# Patient Record
Sex: Female | Born: 1965 | Race: White | Hispanic: Refuse to answer | Marital: Married | Smoking: Never smoker
Health system: Southern US, Community
[De-identification: ages and names within clinical notes are randomized; demographics above are authoritative.]

## PROBLEM LIST (undated history)

## (undated) DIAGNOSIS — F329 Major depressive disorder, single episode, unspecified: Secondary | ICD-10-CM

## (undated) DIAGNOSIS — F419 Anxiety disorder, unspecified: Secondary | ICD-10-CM

## (undated) DIAGNOSIS — F32A Depression, unspecified: Secondary | ICD-10-CM

## (undated) DIAGNOSIS — E785 Hyperlipidemia, unspecified: Secondary | ICD-10-CM

## (undated) DIAGNOSIS — D649 Anemia, unspecified: Secondary | ICD-10-CM

## (undated) HISTORY — PX: APPENDECTOMY: SHX54

## (undated) HISTORY — DX: Major depressive disorder, single episode, unspecified: F32.9

## (undated) HISTORY — DX: Hyperlipidemia, unspecified: E78.5

## (undated) HISTORY — PX: EYE SURGERY: SHX253

## (undated) HISTORY — DX: Anxiety disorder, unspecified: F41.9

## (undated) HISTORY — DX: Depression, unspecified: F32.A

## (undated) HISTORY — PX: HAND SURGERY: SHX662

## (undated) HISTORY — DX: Anemia, unspecified: D64.9

## (undated) HISTORY — PX: TUBAL LIGATION: SHX77

---

## 1991-04-16 HISTORY — PX: OTHER SURGICAL HISTORY: SHX169

## 2001-04-15 HISTORY — PX: REFRACTIVE SURGERY: SHX103

## 2009-04-27 DIAGNOSIS — F32A Depression, unspecified: Secondary | ICD-10-CM | POA: Insufficient documentation

## 2009-04-27 DIAGNOSIS — L989 Disorder of the skin and subcutaneous tissue, unspecified: Secondary | ICD-10-CM | POA: Insufficient documentation

## 2009-04-27 DIAGNOSIS — B009 Herpesviral infection, unspecified: Secondary | ICD-10-CM | POA: Insufficient documentation

## 2009-05-13 DIAGNOSIS — E7800 Pure hypercholesterolemia, unspecified: Secondary | ICD-10-CM | POA: Insufficient documentation

## 2009-05-17 DIAGNOSIS — D649 Anemia, unspecified: Secondary | ICD-10-CM

## 2009-05-17 DIAGNOSIS — D509 Iron deficiency anemia, unspecified: Secondary | ICD-10-CM | POA: Insufficient documentation

## 2009-05-17 HISTORY — DX: Anemia, unspecified: D64.9

## 2014-07-20 LAB — CBC AND DIFFERENTIAL
HCT: 40 % (ref 36–46)
HEMOGLOBIN: 13.2 g/dL (ref 12.0–16.0)
PLATELETS: 306 10*3/uL (ref 150–399)
WBC: 6.6 10^3/mL

## 2014-07-20 LAB — BASIC METABOLIC PANEL
BUN: 12 mg/dL (ref 4–21)
CREATININE: 0.7 mg/dL (ref 0.5–1.1)
GLUCOSE: 85 mg/dL
POTASSIUM: 5.6 mmol/L — AB (ref 3.4–5.3)
SODIUM: 143 mmol/L (ref 137–147)

## 2014-07-20 LAB — HEPATIC FUNCTION PANEL
ALT: 14 U/L (ref 7–35)
AST: 17 U/L (ref 13–35)

## 2014-07-20 LAB — HM PAP SMEAR: HM PAP: NEGATIVE

## 2014-12-15 ENCOUNTER — Other Ambulatory Visit: Payer: Self-pay | Admitting: Physician Assistant

## 2014-12-15 DIAGNOSIS — F419 Anxiety disorder, unspecified: Secondary | ICD-10-CM

## 2014-12-15 NOTE — Telephone Encounter (Signed)
Pt contacted office for refill request on the following medications:  Alprazolam 0.5mg .  [facility]  YQ#[phone]/[id number]

## 2014-12-16 ENCOUNTER — Other Ambulatory Visit: Payer: Self-pay

## 2014-12-16 NOTE — Telephone Encounter (Signed)
Please refill. Thanks!

## 2014-12-21 DIAGNOSIS — F411 Generalized anxiety disorder: Secondary | ICD-10-CM | POA: Insufficient documentation

## 2014-12-21 DIAGNOSIS — F419 Anxiety disorder, unspecified: Secondary | ICD-10-CM | POA: Insufficient documentation

## 2014-12-21 MED ORDER — ALPRAZOLAM 0.5 MG PO TBDP: ORAL_TABLET | ORAL | Status: DC

## 2014-12-21 NOTE — Telephone Encounter (Signed)
This has been printed and placed up front for pick up.

## 2014-12-23 NOTE — Telephone Encounter (Signed)
[facility] [address] Per pt's request.  Thanks,

## 2015-03-26 ENCOUNTER — Other Ambulatory Visit: Payer: Self-pay | Admitting: Family Medicine

## 2015-03-27 NOTE — Telephone Encounter (Signed)
See refill request.

## 2015-03-28 ENCOUNTER — Telehealth: Payer: Self-pay | Admitting: Physician Assistant

## 2015-03-28 ENCOUNTER — Other Ambulatory Visit: Payer: Self-pay | Admitting: Family Medicine

## 2015-03-28 DIAGNOSIS — F419 Anxiety disorder, unspecified: Secondary | ICD-10-CM

## 2015-03-28 NOTE — Telephone Encounter (Signed)
See refill request.

## 2015-03-28 NOTE — Telephone Encounter (Signed)
Pt stated she has been trying to get a refill for [facility] Rock NephewPt stated that the last time she got dissolvable tablets and she didn't like them. Pt would like to go back to taking the tablets that you can swallow. Pt would like a call back to advise her of the RX. Thanks TNP

## 2015-03-29 MED ORDER — ALPRAZOLAM 0.5 MG PO TABS: 0.5000 mg | ORAL_TABLET | Freq: Every day | ORAL | Status: DC | PRN

## 2015-03-29 NOTE — Telephone Encounter (Signed)
Can we please call in Rx for alprazolam 0.5 mg tab Take 1 tab PO daily prn #30 refills 1 as ordered above to [facility]. Thanks!!!

## 2015-03-29 NOTE — Telephone Encounter (Signed)
LM Re: [facility] [address]  Thanks,  -Daneshia Tavano

## 2015-08-28 ENCOUNTER — Telehealth: Payer: Self-pay | Admitting: Physician Assistant

## 2015-08-28 DIAGNOSIS — F419 Anxiety disorder, unspecified: Secondary | ICD-10-CM

## 2015-08-28 MED ORDER — ALPRAZOLAM 0.5 MG PO TABS: 0.5000 mg | ORAL_TABLET | Freq: Every day | ORAL | Status: DC | PRN

## 2015-08-28 NOTE — Telephone Encounter (Signed)
Pt contacted office for refill request on the following medications: ALPRAZolam (XANAX) 0.5 MG tablet to [facility] Rock NephewPt is scheduled for CPE on 08/31/15. Last OV: 07/20/14 Last written: 03/29/15 with 1 refill. Please advise. Thanks TNP

## 2015-08-28 NOTE — Telephone Encounter (Signed)
Prescription called in to [facility] [address].  Thanks,  -Joseline 

## 2015-08-28 NOTE — Telephone Encounter (Signed)
Can we please call in the Rx as above for alprazolam 0.5mg  1 tab PO daily prn anxiety #30 NR. Thanks.

## 2015-08-28 NOTE — Telephone Encounter (Signed)
Please advise.  Thanks,  -Alixis Harmon 

## 2015-08-31 ENCOUNTER — Encounter: Payer: Self-pay | Admitting: Physician Assistant

## 2015-09-05 DIAGNOSIS — N951 Menopausal and female climacteric states: Secondary | ICD-10-CM | POA: Insufficient documentation

## 2015-09-05 DIAGNOSIS — H9190 Unspecified hearing loss, unspecified ear: Secondary | ICD-10-CM | POA: Insufficient documentation

## 2015-09-05 DIAGNOSIS — M79609 Pain in unspecified limb: Secondary | ICD-10-CM | POA: Insufficient documentation

## 2015-09-05 DIAGNOSIS — M65319 Trigger thumb, unspecified thumb: Secondary | ICD-10-CM | POA: Insufficient documentation

## 2015-09-06 ENCOUNTER — Encounter: Payer: Self-pay | Admitting: Physician Assistant

## 2015-09-06 ENCOUNTER — Ambulatory Visit (INDEPENDENT_AMBULATORY_CARE_PROVIDER_SITE_OTHER): Payer: BC Managed Care – PPO | Admitting: Physician Assistant

## 2015-09-06 VITALS — BP 100/60 | HR 65 | Temp 97.5°F | Resp 16 | Ht 63.0 in | Wt 143.2 lb

## 2015-09-06 DIAGNOSIS — Z0000 Encounter for general adult medical examination without abnormal findings: Secondary | ICD-10-CM

## 2015-09-06 DIAGNOSIS — Z1239 Encounter for other screening for malignant neoplasm of breast: Secondary | ICD-10-CM | POA: Diagnosis not present

## 2015-09-06 DIAGNOSIS — D509 Iron deficiency anemia, unspecified: Secondary | ICD-10-CM | POA: Diagnosis not present

## 2015-09-06 DIAGNOSIS — H9193 Unspecified hearing loss, bilateral: Secondary | ICD-10-CM

## 2015-09-06 DIAGNOSIS — E7800 Pure hypercholesterolemia, unspecified: Secondary | ICD-10-CM | POA: Diagnosis not present

## 2015-09-06 DIAGNOSIS — F419 Anxiety disorder, unspecified: Secondary | ICD-10-CM | POA: Diagnosis not present

## 2015-09-06 NOTE — Progress Notes (Signed)
Patient: Pamela Taylor, Female    DOB: 03/24/1966, 50 y.o.   MRN: [id number] Visit Date: 09/06/2015  Today's Provider: Margaretann LovelessJennifer M Burnette, PA-C   Chief Complaint  Patient presents with  . Annual Exam   Subjective:    Annual physical exam Pamela Taylor is a 50 y.o. female who presents today for health maintenance and complete physical. She feels well. She reports exercising daily she does cardio-runs and walk and Yoga. She reports she is sleeping well.   Colonoscopy:09/2013. Repeat in 10 years. No polyps-Normal Pap:07/25/2014 Normal Mammogram: done in [place] and reported normal -----------------------------------------------------------------   Review of Systems  Constitutional: Negative.   HENT: Negative.   Eyes: Negative.   Respiratory: Negative.   Cardiovascular: Negative.   Gastrointestinal: Negative.   Endocrine: Negative.   Genitourinary: Negative.   Musculoskeletal: Negative.   Skin: Negative.   Allergic/Immunologic: Negative.   Neurological: Negative.   Hematological: Negative.   Psychiatric/Behavioral: Negative.     Social History      She  reports that she has never smoked. She has never used smokeless tobacco. She reports that she drinks alcohol. She reports that she does not use illicit drugs.       Social History   Social History  . Marital Status: Married    Spouse Name: N/A  . Number of Children: N/A  . Years of Education: N/A   Social History Main Topics  . Smoking status: Never Smoker   . Smokeless tobacco: Never Used  . Alcohol Use: Yes     Comment: Occasional   . Drug Use: No  . Sexual Activity: Not Asked   Other Topics Concern  . None   Social History Narrative    Past Medical History  Diagnosis Date  . Anemia 05/17/2009  . Hyperlipidemia   . Depression      Patient Active Problem List   Diagnosis Date Noted  . Difficulty hearing 09/05/2015  . Pain in soft tissues of limb 09/05/2015  . 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 Acute anxiety 12/21/2014  . Anemia, iron deficiency 05/17/2009  . Hypercholesterolemia without hypertriglyceridemia 05/13/2009  . Clinical depression 04/27/2009  . Dermatologic disease 04/27/2009  . Herpes 04/27/2009    Past Surgical History  Procedure Laterality Date  . Refractive surgery Bilateral 2003  . Cesarean section  [phone],1992,1993  . Btl  1993    Family History        Family Status  Relation Status Death Age  . Father      substance abuse, alcoholism in revocery. Early Dementia, CHF. No previous AMI; emphysema  . Sister Alive   . Brother Alive         Her family history includes Cancer (age of onset: 3855) in her mother; Depression in her father.    No Known Allergies  Previous Medications   ALPRAZOLAM (XANAX) 0.5 MG TABLET    Take 1 tablet (0.5 mg total) by mouth daily as needed for anxiety.   PAROXETINE (PAXIL) 20 MG TABLET    Take 1 tablet by mouth daily.    Patient Care Team: Margaretann LovelessJennifer M Burnette, PA-C as PCP - General (Family Medicine)     Objective:   Vitals: BP 100/60 mmHg  Pulse 65  Temp(Src) 97.5 F (36.4 C) (Oral)  Resp 16  Ht 5\' 3"  (1.6 m)  Wt 143 lb 3.2 oz (64.955 kg)  BMI 25.37 kg/m2   Physical Exam  Constitutional: She  is oriented to person, place, and time. She appears well-developed and well-nourished. No distress.  HENT:  Head: Normocephalic and atraumatic.  Right Ear: Hearing, tympanic membrane, external ear and ear canal normal.  Left Ear: Hearing, tympanic membrane, external ear and ear canal normal.  Nose: Nose normal.  Mouth/Throat: Uvula is midline, oropharynx is clear and moist and mucous membranes are normal. No oropharyngeal exudate.  Eyes: Conjunctivae and EOM are normal. Pupils are equal, round, and reactive to light. Right eye exhibits no discharge. Left eye exhibits no discharge. No scleral icterus.  Neck: Normal range of motion. Neck supple. No JVD  present. Carotid bruit is not present. No tracheal deviation present. No thyromegaly present.  Cardiovascular: Normal rate, regular rhythm, normal heart sounds and intact distal pulses.  Exam reveals no gallop and no friction rub.   No murmur heard. Pulmonary/Chest: Effort normal and breath sounds normal. No respiratory distress. She has no wheezes. She has no rales. She exhibits no tenderness. Right breast exhibits no inverted nipple, no mass, no nipple discharge, no skin change and no tenderness. Left breast exhibits no inverted nipple, no mass, no nipple discharge, no skin change and no tenderness. Breasts are symmetrical.  Abdominal: Soft. Bowel sounds are normal. She exhibits no distension and no mass. There is no tenderness. There is no rebound and no guarding. Hernia confirmed negative in the right inguinal area and confirmed negative in the left inguinal area.  Genitourinary: Rectum normal, vagina normal and uterus normal. No breast swelling, tenderness, discharge or bleeding. Pelvic exam was performed with patient supine. There is no rash, tenderness, lesion or injury on the right labia. There is no rash, tenderness, lesion or injury on the left labia. Cervix exhibits no motion tenderness, no discharge and no friability. Right adnexum displays no mass, no tenderness and no fullness. Left adnexum displays no mass, no tenderness and no fullness. No erythema, tenderness or bleeding in the vagina. No signs of injury around the vagina. No vaginal discharge found.  Musculoskeletal: Normal range of motion. She exhibits no edema or tenderness.  Lymphadenopathy:    She has no cervical adenopathy.       Right: No inguinal adenopathy present.       Left: No inguinal adenopathy present.  Neurological: She is alert and oriented to person, place, and time. She has normal reflexes. No cranial nerve deficit. Coordination normal.  Skin: Skin is warm and dry. No rash noted. She is not diaphoretic.  Psychiatric:  She has a normal mood and affect. Her behavior is normal. Judgment and thought content normal.  Vitals reviewed.    Depression Screen PHQ 2/9 Scores 09/06/2015  PHQ - 2 Score 0      Assessment & Plan:     Routine Health Maintenance and Physical Exam  Exercise Activities and Dietary recommendations Goals    None       There is no immunization history on file for this patient.  Health Maintenance  Topic Date Due  . Hepatitis C Screening  05-20-1965  . HIV Screening  04/29/1978  . TETANUS/TDAP  04/29/1982  . PAP SMEAR  04/29/1984  . MAMMOGRAM  04/29/2013  . INFLUENZA VACCINE  11/14/2015  . COLONOSCOPY  09/14/2023      Discussed health benefits of physical activity, and encouraged her to engage in regular exercise appropriate for her age and condition.  1. Annual physical exam Normal physical exam today. Will check labs as below and f/u pending lab results. If labs are stable and  WNL she will not need to have these rechecked for one year at her next annual physical exam. She is to call the office in the meantime if she has any acute issue, questions or concerns. - CBC with Differential/Platelet - Comprehensive metabolic panel - TSH  2. Breast cancer screening Breast exam today was normal. There is no family history of breast cancer. She does perform regular self breast exams. Mammogram was ordered as below. Information for [facility] clinic was given to patient so she may schedule her mammogram at her convenience. - MM DIGITAL SCREENING BILATERAL; Future  3. Acute anxiety Stable. Continue current medical treatment of xanax prn.  4. Anemia, iron deficiency Will check CBC and follow up pending results.  5. Hypercholesterolemia without hypertriglyceridemia Will check labs and f/u pending results. - Lipid panel  6. Difficulty hearing, bilateral Reports increased hearing difficulties bilaterally that is starting to bother her. Will refer to ENT for audiological  testing. - Ambulatory referral to ENT    --------------------------------------------------------------------

## 2015-09-06 NOTE — Patient Instructions (Signed)
Health Maintenance, Female Adopting a healthy lifestyle and getting preventive care can go a long way to promote health and wellness. Talk with your health care provider about what schedule of regular examinations is right for you. This is a good chance for you to check in with your provider about disease prevention and staying healthy. In between checkups, there are plenty of things you can do on your own. Experts have done a lot of research about which lifestyle changes and preventive measures are most likely to keep you healthy. Ask your health care provider for more information. WEIGHT AND DIET  Eat a healthy diet  Be sure to include plenty of vegetables, fruits, low-fat dairy products, and lean protein.  Do not eat a lot of foods high in solid fats, added sugars, or salt.  Get regular exercise. This is one of the most important things you can do for your health.  Most adults should exercise for at least 150 minutes each week. The exercise should increase your heart rate and make you sweat (moderate-intensity exercise).  Most adults should also do strengthening exercises at least twice a week. This is in addition to the moderate-intensity exercise.  Maintain a healthy weight  Body mass index (BMI) is a measurement that can be used to identify possible weight problems. It estimates body fat based on height and weight. Your health care provider can help determine your BMI and help you achieve or maintain a healthy weight.  For females 20 years of age and older:   A BMI below 18.5 is considered underweight.  A BMI of 18.5 to 24.9 is normal.  A BMI of 25 to 29.9 is considered overweight.  A BMI of 30 and above is considered obese.  Watch levels of cholesterol and blood lipids  You should start having your blood tested for lipids and cholesterol at 52 years of age, then have this test every 5 years.  You may need to have your cholesterol levels checked more often if:  Your lipid  or cholesterol levels are high.  You are older than 52 years of age.  You are at high risk for heart disease.  CANCER SCREENING   Lung Cancer  Lung cancer screening is recommended for adults 55-80 years old who are at high risk for lung cancer because of a history of smoking.  A yearly low-dose CT scan of the lungs is recommended for people who:  Currently smoke.  Have quit within the past 15 years.  Have at least a 30-pack-year history of smoking. A pack year is smoking an average of one pack of cigarettes a day for 1 year.  Yearly screening should continue until it has been 15 years since you quit.  Yearly screening should stop if you develop a health problem that would prevent you from having lung cancer treatment.  Breast Cancer  Practice breast self-awareness. This means understanding how your breasts normally appear and feel.  It also means doing regular breast self-exams. Let your health care provider know about any changes, no matter how small.  If you are in your 20s or 30s, you should have a clinical breast exam (CBE) by a health care provider every 1-3 years as part of a regular health exam.  If you are 40 or older, have a CBE every year. Also consider having a breast X-ray (mammogram) every year.  If you have a family history of breast cancer, talk to your health care provider about genetic screening.  If you   are at high risk for breast cancer, talk to your health care provider about having an MRI and a mammogram every year.  Breast cancer gene (BRCA) assessment is recommended for women who have family members with BRCA-related cancers. BRCA-related cancers include:  Breast.  Ovarian.  Tubal.  Peritoneal cancers.  Results of the assessment will determine the need for genetic counseling and BRCA1 and BRCA2 testing. Cervical Cancer Your health care provider may recommend that you be screened regularly for cancer of the pelvic organs (ovaries, uterus, and  vagina). This screening involves a pelvic examination, including checking for microscopic changes to the surface of your cervix (Pap test). You may be encouraged to have this screening done every 3 years, beginning at age 21.  For women ages 30-65, health care providers may recommend pelvic exams and Pap testing every 3 years, or they may recommend the Pap and pelvic exam, combined with testing for human papilloma virus (HPV), every 5 years. Some types of HPV increase your risk of cervical cancer. Testing for HPV may also be done on women of any age with unclear Pap test results.  Other health care providers may not recommend any screening for nonpregnant women who are considered low risk for pelvic cancer and who do not have symptoms. Ask your health care provider if a screening pelvic exam is right for you.  If you have had past treatment for cervical cancer or a condition that could lead to cancer, you need Pap tests and screening for cancer for at least 20 years after your treatment. If Pap tests have been discontinued, your risk factors (such as having a new sexual partner) need to be reassessed to determine if screening should resume. Some women have medical problems that increase the chance of getting cervical cancer. In these cases, your health care provider may recommend more frequent screening and Pap tests. Colorectal Cancer  This type of cancer can be detected and often prevented.  Routine colorectal cancer screening usually begins at 52 years of age and continues through 52 years of age.  Your health care provider may recommend screening at an earlier age if you have risk factors for colon cancer.  Your health care provider may also recommend using home test kits to check for hidden blood in the stool.  A small camera at the end of a tube can be used to examine your colon directly (sigmoidoscopy or colonoscopy). This is done to check for the earliest forms of colorectal  cancer.  Routine screening usually begins at age 50.  Direct examination of the colon should be repeated every 5-10 years through 52 years of age. However, you may need to be screened more often if early forms of precancerous polyps or small growths are found. Skin Cancer  Check your skin from head to toe regularly.  Tell your health care provider about any new moles or changes in moles, especially if there is a change in a mole's shape or color.  Also tell your health care provider if you have a mole that is larger than the size of a pencil eraser.  Always use sunscreen. Apply sunscreen liberally and repeatedly throughout the day.  Protect yourself by wearing long sleeves, pants, a wide-brimmed hat, and sunglasses whenever you are outside. HEART DISEASE, DIABETES, AND HIGH BLOOD PRESSURE   High blood pressure causes heart disease and increases the risk of stroke. High blood pressure is more likely to develop in:  People who have blood pressure in the high end   of the normal range (130-139/85-89 mm Hg).  People who are overweight or obese.  People who are African American.  If you are 38-23 years of age, have your blood pressure checked every 3-5 years. If you are 61 years of age or older, have your blood pressure checked every year. You should have your blood pressure measured twice--once when you are at a hospital or clinic, and once when you are not at a hospital or clinic. Record the average of the two measurements. To check your blood pressure when you are not at a hospital or clinic, you can use:  An automated blood pressure machine at a pharmacy.  A home blood pressure monitor.  If you are between 45 years and 39 years old, ask your health care provider if you should take aspirin to prevent strokes.  Have regular diabetes screenings. This involves taking a blood sample to check your fasting blood sugar level.  If you are at a normal weight and have a low risk for diabetes,  have this test once every three years after 52 years of age.  If you are overweight and have a high risk for diabetes, consider being tested at a younger age or more often. PREVENTING INFECTION  Hepatitis B  If you have a higher risk for hepatitis B, you should be screened for this virus. You are considered at high risk for hepatitis B if:  You were born in a country where hepatitis B is common. Ask your health care provider which countries are considered high risk.  Your parents were born in a high-risk country, and you have not been immunized against hepatitis B (hepatitis B vaccine).  You have HIV or AIDS.  You use needles to inject street drugs.  You live with someone who has hepatitis B.  You have had sex with someone who has hepatitis B.  You get hemodialysis treatment.  You take certain medicines for conditions, including cancer, organ transplantation, and autoimmune conditions. Hepatitis C  Blood testing is recommended for:  Everyone born from [birth year] through 1967.  Anyone with known risk factors for hepatitis C. Sexually transmitted infections (STIs)  You should be screened for sexually transmitted infections (STIs) including gonorrhea and chlamydia if:  You are sexually active and are younger than 52 years of age.  You are older than 53 years of age and your health care provider tells you that you are at risk for this type of infection.  Your sexual activity has changed since you were last screened and you are at an increased risk for chlamydia or gonorrhea. Ask your health care provider if you are at risk.  If you do not have HIV, but are at risk, it may be recommended that you take a prescription medicine daily to prevent HIV infection. This is called pre-exposure prophylaxis (PrEP). You are considered at risk if:  You are sexually active and do not regularly use condoms or know the HIV status of your partner(s).  You take drugs by injection.  You are sexually  active with a partner who has HIV. Talk with your health care provider about whether you are at high risk of being infected with HIV. If you choose to begin PrEP, you should first be tested for HIV. You should then be tested every 3 months for as long as you are taking PrEP.  PREGNANCY   If you are premenopausal and you may become pregnant, ask your health care provider about preconception counseling.  If you may  become pregnant, take 400 to 800 micrograms (mcg) of folic acid every day.  If you want to prevent pregnancy, talk to your health care provider about birth control (contraception). OSTEOPOROSIS AND MENOPAUSE   Osteoporosis is a disease in which the bones lose minerals and strength with aging. This can result in serious bone fractures. Your risk for osteoporosis can be identified using a bone density scan.  If you are 61 years of age or older, or if you are at risk for osteoporosis and fractures, ask your health care provider if you should be screened.  Ask your health care provider whether you should take a calcium or vitamin D supplement to lower your risk for osteoporosis.  Menopause may have certain physical symptoms and risks.  Hormone replacement therapy may reduce some of these symptoms and risks. Talk to your health care provider about whether hormone replacement therapy is right for you.  HOME CARE INSTRUCTIONS   Schedule regular health, dental, and eye exams.  Stay current with your immunizations.   Do not use any tobacco products including cigarettes, chewing tobacco, or electronic cigarettes.  If you are pregnant, do not drink alcohol.  If you are breastfeeding, limit how much and how often you drink alcohol.  Limit alcohol intake to no more than 1 drink per day for nonpregnant women. One drink equals 12 ounces of beer, 5 ounces of wine, or 1 ounces of hard liquor.  Do not use street drugs.  Do not share needles.  Ask your health care provider for help if  you need support or information about quitting drugs.  Tell your health care provider if you often feel depressed.  Tell your health care provider if you have ever been abused or do not feel safe at home.   This information is not intended to replace advice given to you by your health care provider. Make sure you discuss any questions you have with your health care provider.   Document Released: 10/15/2010 Document Revised: 04/22/2014 Document Reviewed: 03/03/2013 Elsevier Interactive Patient Education [employer].

## 2015-09-07 ENCOUNTER — Telehealth: Payer: Self-pay

## 2015-09-07 LAB — CBC WITH DIFFERENTIAL/PLATELET
BASOS: 0 %
Basophils Absolute: 0 10*3/uL (ref 0.0–0.2)
EOS (ABSOLUTE): 0.2 10*3/uL (ref 0.0–0.4)
EOS: 2 %
HEMATOCRIT: 41.7 % (ref 34.0–46.6)
Hemoglobin: 14.6 g/dL (ref 11.1–15.9)
IMMATURE GRANS (ABS): 0 10*3/uL (ref 0.0–0.1)
IMMATURE GRANULOCYTES: 0 %
LYMPHS: 31 %
Lymphocytes Absolute: 2.7 10*3/uL (ref 0.7–3.1)
MCH: 32.1 pg (ref 26.6–33.0)
MCHC: 35 g/dL (ref 31.5–35.7)
MCV: 92 fL (ref 79–97)
Monocytes Absolute: 0.5 10*3/uL (ref 0.1–0.9)
Monocytes: 6 %
NEUTROS PCT: 61 %
Neutrophils Absolute: 5.1 10*3/uL (ref 1.4–7.0)
PLATELETS: 250 10*3/uL (ref 150–379)
RBC: 4.55 x10E6/uL (ref 3.77–5.28)
RDW: 12.8 % (ref 12.3–15.4)
WBC: 8.6 10*3/uL (ref 3.4–10.8)

## 2015-09-07 LAB — COMPREHENSIVE METABOLIC PANEL
ALK PHOS: 60 IU/L (ref 39–117)
ALT: 16 IU/L (ref 0–32)
AST: 18 IU/L (ref 0–40)
Albumin/Globulin Ratio: 1.6 (ref 1.2–2.2)
Albumin: 4.4 g/dL (ref 3.5–5.5)
BILIRUBIN TOTAL: 0.4 mg/dL (ref 0.0–1.2)
BUN/Creatinine Ratio: 14 (ref 9–23)
BUN: 9 mg/dL (ref 6–24)
CHLORIDE: 99 mmol/L (ref 96–106)
CO2: 25 mmol/L (ref 18–29)
CREATININE: 0.66 mg/dL (ref 0.57–1.00)
Calcium: 9.7 mg/dL (ref 8.7–10.2)
GFR calc Af Amer: 117 mL/min/{1.73_m2} (ref >59)
GFR calc non Af Amer: 102 mL/min/{1.73_m2} (ref >59)
GLOBULIN, TOTAL: 2.8 g/dL (ref 1.5–4.5)
GLUCOSE: 82 mg/dL (ref 65–99)
Potassium: 4.5 mmol/L (ref 3.5–5.2)
SODIUM: 140 mmol/L (ref 134–144)
Total Protein: 7.2 g/dL (ref 6.0–8.5)

## 2015-09-07 LAB — LIPID PANEL
CHOLESTEROL TOTAL: 217 mg/dL — AB (ref 100–199)
Chol/HDL Ratio: 3.6 ratio units (ref 0.0–4.4)
HDL: 60 mg/dL (ref >39)
LDL CALC: 132 mg/dL — AB (ref 0–99)
TRIGLYCERIDES: 127 mg/dL (ref 0–149)
VLDL Cholesterol Cal: 25 mg/dL (ref 5–40)

## 2015-09-07 LAB — TSH: TSH: 2.6 u[IU]/mL (ref 0.450–4.500)

## 2015-09-07 NOTE — Telephone Encounter (Signed)
-----   Message from Margaretann LovelessJennifer M Burnette, [place]-C sent at 09/07/2015  8:32 AM EDT ----- All labs are within normal limits and stable with exception of cholesterol. Total cholesterol and LDL (bad) cholesterol are both borderline high. Your good cholesterol is over 59 which offers some cardioprotection and keeps you from needing a cholesterol lowering medication at this time. Limit saturated fats and foods high in cholesterol from diet. Continue physical activity. May consider adding fish oil 1200mg  twice daily or megared supplement to help naturally lower cholesterol. Will recheck in one year at next physical.  Thanks! -JB

## 2015-09-07 NOTE — Telephone Encounter (Signed)
Patient advised as directed below.  Thanks,  -Josline

## 2015-09-07 NOTE — Telephone Encounter (Signed)
LMTCB  Thanks,  -Joseline 

## 2015-11-06 ENCOUNTER — Ambulatory Visit: Payer: Self-pay

## 2015-11-07 ENCOUNTER — Telehealth: Payer: Self-pay | Admitting: Physician Assistant

## 2015-11-07 DIAGNOSIS — B001 Herpesviral vesicular dermatitis: Secondary | ICD-10-CM

## 2015-11-07 DIAGNOSIS — F411 Generalized anxiety disorder: Secondary | ICD-10-CM

## 2015-11-07 DIAGNOSIS — F419 Anxiety disorder, unspecified: Secondary | ICD-10-CM

## 2015-11-07 MED ORDER — ALPRAZOLAM 0.5 MG PO TABS: 0.5000 mg | ORAL_TABLET | Freq: Every day | ORAL | 3 refills | Status: DC | PRN

## 2015-11-07 MED ORDER — PAROXETINE HCL 20 MG PO TABS: 20.0000 mg | ORAL_TABLET | Freq: Every day | ORAL | 11 refills | Status: DC

## 2015-11-07 MED ORDER — VALACYCLOVIR HCL 1 G PO TABS: 1000.0000 mg | ORAL_TABLET | Freq: Every day | ORAL | 5 refills | Status: DC

## 2015-11-07 NOTE — Telephone Encounter (Signed)
Pt is requesting refill on Alprazolam 0.5 MG and Paroxetine 20 MG. 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

## 2015-11-07 NOTE — Telephone Encounter (Signed)
Prescription called in to [facility] [address].  Thanks,  -Joseline 

## 2015-11-07 NOTE — Telephone Encounter (Signed)
Please call in alprazolam 0.5mg  take one tab po daily prn anxiety #30 3 RF to [facility]. Thanks. All others sent electronically.

## 2015-11-16 ENCOUNTER — Telehealth: Payer: Self-pay | Admitting: Physician Assistant

## 2015-11-16 NOTE — Telephone Encounter (Signed)
Pt stated that she was advised to call back to schedule time with Antony Contras to freeze spots off her face. I scheduled appt for 11/20/15 at 415 pm and the 430 pm slot is unavailable. Pt wanted to make sure that this is still something that Antony Contras feels comfortable doing. Please advise. Thanks TNP

## 2015-11-17 NOTE — Telephone Encounter (Signed)
Yes that is fine

## 2015-11-20 ENCOUNTER — Encounter: Payer: Self-pay | Admitting: Physician Assistant

## 2015-11-20 ENCOUNTER — Ambulatory Visit (INDEPENDENT_AMBULATORY_CARE_PROVIDER_SITE_OTHER): Payer: BC Managed Care – PPO | Admitting: Physician Assistant

## 2015-11-20 VITALS — BP 120/80 | HR 62 | Resp 16 | Wt 144.2 lb

## 2015-11-20 DIAGNOSIS — L989 Disorder of the skin and subcutaneous tissue, unspecified: Secondary | ICD-10-CM

## 2015-11-20 NOTE — Progress Notes (Signed)
       Patient: Quincy Simmondslizabeth Wilbourne Female    DOB: [birth year]   50 y.o.   MRN: [id number] Visit Date: 11/20/2015  Today's Provider: Margaretann LovelessJennifer M Burnette, PA-C   Chief Complaint  Patient presents with  . Skin Tags Remove   Subjective:    HPI  Liana CrockerBetsy Jipson is a 50 yr old female that returns to the office today for cryotherapy for some small lesions noted on her face. She did have a skin cancer check and they also advised her to have these areas removed at some point, because they would most likely only grow.     No Known Allergies Current Meds  Medication Sig  . ALPRAZolam (XANAX) 0.5 MG tablet Take 1 tablet (0.5 mg total) by mouth daily as needed for anxiety.  Marland Kitchen. PARoxetine (PAXIL) 20 MG tablet Take 1 tablet (20 mg total) by mouth daily.  . valACYclovir (VALTREX) 1000 MG tablet Take 1 tablet (1,000 mg total) by mouth daily. As needed for cold sores.    Review of Systems  Constitutional: Negative.   HENT: Positive for hearing loss (she did go to audioloist and was found to have 15-20% hearing loss bilaterally).   Respiratory: Negative.   Cardiovascular: Negative.   Skin:       Small atypical lesions on face    Social History  Substance Use Topics  . Smoking status: Never Smoker  . Smokeless tobacco: Never Used  . Alcohol use Yes     Comment: Occasional    Objective:   BP 120/80 (BP Location: Left Arm, Patient Position: Sitting, Cuff Size: Normal)   Resp 16   Wt 144 lb 3.2 oz (65.4 kg)   BMI 25.54 kg/m   Physical Exam  Constitutional: She appears well-developed and well-nourished. No distress.  Cardiovascular: Normal rate, regular rhythm and normal heart sounds.  Exam reveals no gallop and no friction rub.   No murmur heard. Pulmonary/Chest: Effort normal and breath sounds normal. No respiratory distress. She has no wheezes. She has no rales.  Skin: Lesion (small, rough patches of skin located diffusely across face, most at the hairline near the ears and some on the  cheeks; thought to be early keratotic lesions) noted. She is not diaphoretic.  Vitals reviewed.      Assessment & Plan:     1. Skin lesion of face Cryotherapy pen was used for destruction of the skin lesions on the face. There were approximately 10-15 lesions that were destroyed. Clean face with soap and water. Discussed healing process following cryotherapy. Once areas start to heal she may apply vitamin e to her face. She is to call if any signs of infection occur, if there are areas that seem to need a second treatment, or if new area develops.        Margaretann LovelessJennifer M Burnette, PA-C  [facility]

## 2015-11-20 NOTE — Patient Instructions (Signed)
Excision of Skin Lesions °Excision of a skin lesion refers to the removal of a section of skin by making small cuts (incisions) in the skin. This procedure may be done to remove a cancerous (malignant) or noncancerous (benign) growth on the skin. It is typically done to treat or prevent cancer or infection. It may also be done to improve cosmetic appearance. °The procedure may be done to remove: °· Cancerous growths, such as basal cell carcinoma, squamous cell carcinoma, or melanoma. °· Noncancerous growths, such as a cyst or lipoma. °· Growths, such as moles or skin tags, which may be removed for cosmetic reasons. °Various excision or surgical techniques may be used depending on your condition, the location of the lesion, and your overall health. °LET YOUR HEALTH CARE PROVIDER KNOW ABOUT: °· Any allergies you have. °· All medicines you are taking, including vitamins, herbs, eye drops, creams, and over-the-counter medicines. °· Previous problems you or members of your family have had with the use of anesthetics. °· Any blood disorders you have. °· Previous surgeries you have had. °· Any medical conditions you have. °· Whether you are pregnant or may be pregnant. °RISKS AND COMPLICATIONS °Generally, this is a safe procedure. However, problems may occur, including: °· Bleeding. °· Infection. °· Scarring. °· Recurrence of the cyst, lipoma, or cancer. °· Changes in skin sensation or appearance, such as discoloration or swelling. °· Reaction to the anesthetics. °· Allergic reaction to surgical materials or ointments. °· Damage to nerves, blood vessels, muscles, or other structures. °· Continued pain. °BEFORE THE PROCEDURE °· Ask your health care provider about: °¨ Changing or stopping your regular medicines. This is especially important if you are taking diabetes medicines or blood thinners. °¨ Taking medicines such as aspirin and ibuprofen. These medicines can thin your blood. Do not take these medicines before your  procedure if your health care provider instructs you not to. °· You may be asked to take certain medicines. °· You may be asked to stop smoking. °· You may have an exam or testing. °· Plan to have someone take you home after the procedure. °· Plan to have someone help you with activities during recovery. °PROCEDURE °· To reduce your risk of infection: °¨ Your health care team will wash or sanitize their hands. °¨ Your skin will be washed with soap. °· You will be given a medicine to numb the area (local anesthetic). °· One of the following excision techniques will be performed. °· At the end of any of these procedures, antibiotic ointment will be applied as needed. °Each of the following techniques may vary among health care providers and hospitals. °Complete Surgical Excision °The area of skin that needs to be removed will be marked with a pen. Using a small scalpel or scissors, the surgeon will gently cut around and under the lesion until it is completely removed. The lesion will be placed in a fluid and sent to the lab for examination. If necessary, bleeding will be controlled with a device that delivers heat (electrocautery). The edges of the wound may be stitched (sutured) together, and a bandage (dressing) will be applied. This procedure may be performed to treat a cancerous growth or a noncancerous cyst or lesion. °Excision of a Cyst °The surgeon will make an incision on the cyst. The entire cyst will be removed through the incision. The incision may be closed with sutures. °Shave Excision °During shave excision, the surgeon will use a small blade or an electrically heated loop instrument to   shave off the lesion. This may be done to remove a mole or a skin tag. The wound will usually be left to heal on its own without sutures. °Punch Excision °During punch excision, the surgeon will use a small tool that is like a cookie cutter or a hole punch to cut a circle shape out of the skin. The outer edges of the skin  will be sutured together. This may be done to remove a mole or a scar or to perform a biopsy of the lesion. °Mohs Micrographic Surgery °During Mohs micrographic surgery, layers of the lesion will be removed with a scalpel or a loop instrument and will be examined right away under a microscope. Layers will be removed until all of the abnormal or cancerous tissue has been removed. This procedure is minimally invasive, and it ensures the best cosmetic outcome. It involves the removal of as little normal tissue as possible. Mohs is usually done to treat skin cancer, such as basal cell carcinoma or squamous cell carcinoma, particularly on the face and ears. Depending on the size of the surgical wound, it may be sutured closed. °AFTER THE PROCEDURE °· Return to your normal activities as told by your health care provider. °· Talk with your health care provider to discuss any test results, treatment options, and if necessary, the need for more tests. °  °This information is not intended to replace advice given to you by your health care provider. Make sure you discuss any questions you have with your health care provider. °  °Document Released: 06/26/2009 Document Revised: 12/21/2014 Document Reviewed: 05/18/2014 °Elsevier Interactive Patient Education ©2016 [employer] ° °

## 2015-12-20 ENCOUNTER — Other Ambulatory Visit: Payer: Self-pay | Admitting: Physician Assistant

## 2015-12-20 ENCOUNTER — Ambulatory Visit
Admission: RE | Admit: 2015-12-20 | Discharge: 2015-12-20 | Disposition: A | Discharge status: Home / Self Care | Payer: BC Managed Care – PPO | Source: Ambulatory Visit | Attending: Physician Assistant | Admitting: Physician Assistant

## 2015-12-20 DIAGNOSIS — Z1239 Encounter for other screening for malignant neoplasm of breast: Secondary | ICD-10-CM

## 2015-12-20 DIAGNOSIS — Z1231 Encounter for screening mammogram for malignant neoplasm of breast: Secondary | ICD-10-CM | POA: Diagnosis not present

## 2015-12-27 ENCOUNTER — Telehealth: Payer: Self-pay

## 2015-12-27 NOTE — Telephone Encounter (Signed)
Advised patient of results.  

## 2015-12-27 NOTE — Telephone Encounter (Signed)
-----   Message from Margaretann LovelessJennifer M Burnette, [place]PA-C sent at 12/27/2015 11:04 AM EDT ----- Normal mammogram. Repeat screening in one year.

## 2015-12-27 NOTE — Telephone Encounter (Signed)
Left message to call back  

## 2016-01-05 ENCOUNTER — Ambulatory Visit (INDEPENDENT_AMBULATORY_CARE_PROVIDER_SITE_OTHER): Payer: BC Managed Care – PPO | Admitting: Physician Assistant

## 2016-01-05 ENCOUNTER — Encounter: Payer: Self-pay | Admitting: Physician Assistant

## 2016-01-05 VITALS — BP 120/86 | HR 72 | Temp 97.9°F | Wt 144.0 lb

## 2016-01-05 DIAGNOSIS — Z23 Encounter for immunization: Secondary | ICD-10-CM | POA: Diagnosis not present

## 2016-01-05 DIAGNOSIS — L918 Other hypertrophic disorders of the skin: Secondary | ICD-10-CM | POA: Diagnosis not present

## 2016-01-05 NOTE — Progress Notes (Signed)
       Patient: Pamela Taylor Female    DOB: Sep 27, 1965   50 y.o.   MRN: [id number] Visit Date: 01/05/2016  Today's Provider: Margaretann LovelessJennifer M Kyron Schlitt, PA-C   Chief Complaint  Patient presents with  . Skin Problem   Subjective:    HPI Patient c/o skin tags on her face. Patient is requesting to have skin tags treated. She has been seen for this before.    No Known Allergies   Current Outpatient Prescriptions:  .  ALPRAZolam (XANAX) 0.5 MG tablet, Take 1 tablet (0.5 mg total) by mouth daily as needed for anxiety., Disp: 30 tablet, Rfl: 3 .  PARoxetine (PAXIL) 20 MG tablet, Take 1 tablet (20 mg total) by mouth daily., Disp: 30 tablet, Rfl: 11 .  valACYclovir (VALTREX) 1000 MG tablet, Take 1 tablet (1,000 mg total) by mouth daily. As needed for cold sores., Disp: 30 tablet, Rfl: 5  Review of Systems  Constitutional: Negative.   Respiratory: Negative.   Cardiovascular: Negative.   Skin: Positive for color change.  Neurological: Negative.     Social History  Substance Use Topics  . Smoking status: Never Smoker  . Smokeless tobacco: Never Used  . Alcohol use Yes     Comment: Occasional    Objective:   BP 120/86 (BP Location: Right Arm, Patient Position: Sitting, Cuff Size: Large)   Pulse 72   Temp 97.9 F (36.6 C) (Oral)   Wt 144 lb (65.3 kg)   LMP 11/14/2015   BMI 25.51 kg/m   Physical Exam  Constitutional: She appears well-developed and well-nourished. No distress.  Cardiovascular: Normal rate, regular rhythm and normal heart sounds.  Exam reveals no gallop and no friction rub.   No murmur heard. Pulmonary/Chest: Effort normal and breath sounds normal. No respiratory distress. She has no wheezes. She has no rales.  Skin: She is not diaphoretic.     Vitals reviewed.      Assessment & Plan:     1. Skin tag Cryotherapy to the 3 skin tags. She is to call if no improvement.  2. Need for influenza vaccination Flu vaccine given today without complication.  Patient sat upright for 15 minutes to check for adverse reaction before being released. - Flu Vaccine QUAD 36+ mos IM       Margaretann LovelessJennifer M Larue Lightner, PA-C  [facility]

## 2016-01-08 NOTE — Patient Instructions (Signed)

## 2016-01-12 ENCOUNTER — Other Ambulatory Visit: Payer: Self-pay

## 2016-01-12 DIAGNOSIS — F411 Generalized anxiety disorder: Secondary | ICD-10-CM

## 2016-01-12 MED ORDER — PAROXETINE HCL 20 MG PO TABS: 20.0000 mg | ORAL_TABLET | Freq: Every day | ORAL | 1 refills | Status: DC

## 2016-01-12 NOTE — Telephone Encounter (Signed)
Pharmacy requesting 90 day supply

## 2016-02-06 ENCOUNTER — Other Ambulatory Visit: Payer: Self-pay | Admitting: Physician Assistant

## 2016-02-06 DIAGNOSIS — F419 Anxiety disorder, unspecified: Secondary | ICD-10-CM

## 2016-02-06 MED ORDER — ALPRAZOLAM 0.5 MG PO TABS: 0.5000 mg | ORAL_TABLET | Freq: Every day | ORAL | 5 refills | Status: DC | PRN

## 2016-02-06 NOTE — Telephone Encounter (Signed)
Pt needs refill on her generic xanax,  [facility] uses [facility]  Her call back is  [phone]  Thank sTeri

## 2016-02-07 NOTE — Telephone Encounter (Signed)
rx called into pharmacy

## 2016-02-14 ENCOUNTER — Ambulatory Visit: Payer: BC Managed Care – PPO | Admitting: Physician Assistant

## 2016-04-04 ENCOUNTER — Telehealth: Payer: Self-pay | Admitting: Physician Assistant

## 2016-04-04 DIAGNOSIS — F419 Anxiety disorder, unspecified: Secondary | ICD-10-CM

## 2016-04-04 DIAGNOSIS — B001 Herpesviral vesicular dermatitis: Secondary | ICD-10-CM

## 2016-04-04 MED ORDER — VALACYCLOVIR HCL 1 G PO TABS: 1000.0000 mg | ORAL_TABLET | Freq: Every day | ORAL | 5 refills | Status: DC

## 2016-04-04 NOTE — Telephone Encounter (Signed)
Had CPE 09/06/2015. Allene DillonEmily Drozdowski, CMA

## 2016-04-04 NOTE — Telephone Encounter (Signed)
Per [facility] at [facility], pt does have several refills left over for alprazolam. Pt advised to call pharmacy for refill. Allene DillonEmily Drozdowski, CMA

## 2016-04-04 NOTE — Telephone Encounter (Signed)
Pt contacted office for refill request on the following medications:  [facility]  CB#[phone]/MW  valACYclovir (VALTREX) 1000 MG tablet  ALPRAZolam (XANAX) 0.5 MG tablet

## 2016-04-04 NOTE — Telephone Encounter (Signed)
Check with pharmacy because it looks like we called in alprazolam 0.5 mg Take one tab PO daily prn anxiety #30 5RF on 02/06/16.  If this was not called in then it is ok to give new order.   Valtrex sent electronically.

## 2016-09-26 ENCOUNTER — Other Ambulatory Visit: Payer: Self-pay | Admitting: Physician Assistant

## 2016-09-26 DIAGNOSIS — F419 Anxiety disorder, unspecified: Secondary | ICD-10-CM

## 2016-09-26 MED ORDER — ALPRAZOLAM 0.5 MG PO TABS: 0.5000 mg | ORAL_TABLET | Freq: Every day | ORAL | 5 refills | Status: DC | PRN

## 2016-09-26 NOTE — Telephone Encounter (Signed)
[facility]. Patient advised.  

## 2016-09-26 NOTE — Telephone Encounter (Signed)
Please phone in alprazolam 0.5mg  tabs take 1 tab PO daily prn #30 [facility]

## 2016-09-26 NOTE — Telephone Encounter (Signed)
Pt contacted office for refill request on the following medications: ALPRAZolam (XANAX) 0.5 MG tablet  [facility] [address] Last Rx: 02/06/16 LOV: 01/05/16 [facility] and that she spoke to someone in our office Monday and was advised [facility]. [facility] yesterday 09/25/16 that we had denied the refill. Please advise. Thanks TNP

## 2017-04-03 ENCOUNTER — Other Ambulatory Visit: Payer: Self-pay | Admitting: Physician Assistant

## 2017-04-03 DIAGNOSIS — F419 Anxiety disorder, unspecified: Secondary | ICD-10-CM

## 2017-04-03 MED ORDER — ALPRAZOLAM 0.5 MG PO TABS: 0.5000 mg | ORAL_TABLET | Freq: Every day | ORAL | 5 refills | Status: DC | PRN

## 2017-04-03 NOTE — Telephone Encounter (Signed)
Pt contacted office for refill request on the following medications:  ALPRAZolam (XANAX) 0.5 MG tablet  [facility] [address]  Last Rx: 09/26/16 LOV: 01/05/16  Please advise. Thanks TNP

## 2017-04-03 NOTE — Telephone Encounter (Signed)
[facility]. Left voicemail.  Thanks,  -Trajon Rosete

## 2017-04-03 NOTE — Telephone Encounter (Signed)
Please advise. Thanks.  

## 2017-05-20 ENCOUNTER — Ambulatory Visit: Payer: BC Managed Care – PPO | Admitting: Physician Assistant

## 2017-05-20 ENCOUNTER — Encounter: Payer: Self-pay | Admitting: Physician Assistant

## 2017-05-20 VITALS — BP 134/78 | HR 104 | Temp 101.5°F | Resp 16 | Wt 141.0 lb

## 2017-05-20 DIAGNOSIS — J101 Influenza due to other identified influenza virus with other respiratory manifestations: Secondary | ICD-10-CM | POA: Diagnosis not present

## 2017-05-20 LAB — POCT INFLUENZA A/B
Influenza A, POC: POSITIVE — AB
Influenza B, POC: NEGATIVE

## 2017-05-20 MED ORDER — OSELTAMIVIR PHOSPHATE 75 MG PO CAPS: 75.0000 mg | ORAL_CAPSULE | Freq: Two times a day (BID) | ORAL | 0 refills | Status: AC

## 2017-05-20 NOTE — Progress Notes (Signed)
[facility]  No chief complaint on file.   Subjective:    Patient ID: Pamela Taylor, female    DOB: February 06, 1966, 52 y.o.   MRN: [id number]  Upper Respiratory Infection: Pamela Taylor is a 52 y.o. female complaining of symptoms of a URI. Symptoms include bilateral ear pain, congestion and cough. Onset of symptoms was 2 days ago, gradually worsening since that time. She also c/o bilateral ear pressure/pain, achiness, nasal congestion, post nasal drip and sinus pressure for the past 2 days .  She is drinking plenty of fluids. Evaluation to date: none. Treatment to date: Ibuprofen. The treatment has provided minimal relief.   Review of Systems  Constitutional: Positive for chills, diaphoresis, fatigue and fever. Negative for activity change, appetite change and unexpected weight change.  HENT: Positive for congestion, ear pain, postnasal drip and rhinorrhea. Negative for ear discharge, nosebleeds, sinus pressure, sinus pain, sore throat, tinnitus and trouble swallowing.   Respiratory: Positive for cough. Negative for apnea, choking, chest tightness, shortness of breath, wheezing and stridor.   Gastrointestinal: Negative.   Musculoskeletal: Positive for arthralgias and myalgias.  Neurological: Positive for headaches. Negative for dizziness and light-headedness.       Objective:   BP 134/78 (BP Location: Right Arm, Patient Position: Sitting, Cuff Size: Normal)   Pulse (!) 104   Temp (!) 101.5 F (38.6 C) (Oral)   Resp 16   Wt 141 lb (64 kg)   SpO2 94%   BMI 24.98 kg/m   Patient Active Problem List   Diagnosis Date Noted  . Difficulty hearing 09/05/2015  . Pain in soft tissues of limb 09/05/2015  . 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 Acute anxiety 12/21/2014  . Anemia, iron deficiency 05/17/2009  . Hypercholesterolemia without hypertriglyceridemia 05/13/2009  . Clinical depression 04/27/2009   . Dermatologic disease 04/27/2009  . Herpes 04/27/2009    Outpatient Encounter Medications as of 05/20/2017  Medication Sig  . ALPRAZolam (XANAX) 0.5 MG tablet Take 1 tablet (0.5 mg total) by mouth daily as needed for anxiety.  Marland Kitchen. PARoxetine (PAXIL) 20 MG tablet Take 1 tablet (20 mg total) by mouth daily.  . valACYclovir (VALTREX) 1000 MG tablet Take 1 tablet (1,000 mg total) by mouth daily. As needed for cold sores.   No facility-administered encounter medications on file as of 05/20/2017.     No Known Allergies     Physical Exam  Constitutional: She is oriented to person, place, and time. She appears well-developed and well-nourished. She appears ill.  HENT:  Right Ear: External ear normal.  Left Ear: External ear normal.  Mouth/Throat: Posterior oropharyngeal edema and posterior oropharyngeal erythema present. No oropharyngeal exudate.  Eyes: Conjunctivae are normal.  Neck: Neck supple.  Cardiovascular: Normal rate and regular rhythm.  Pulmonary/Chest: Effort normal and breath sounds normal.  Lymphadenopathy:    She has cervical adenopathy.  Neurological: She is alert and oriented to person, place, and time.  Skin: Skin is warm and dry.  Psychiatric: She has a normal mood and affect. Her behavior is normal.       Assessment & Plan:  1. Influenza A  Rapid flu positive. Tamiflu as below. Work note provided.  - oseltamivir (TAMIFLU) 75 MG capsule; Take 1 capsule (75 mg total) by mouth 2 (two) times daily for 5 days.  Dispense: 10 capsule; Refill: 0 - POCT Influenza A/B  Return if symptoms worsen or fail to improve.  The entirety of the information  documented in the History of Present Illness, Review of Systems and Physical Exam were personally obtained by me. Portions of this information were initially documented by Ashley Royalty, CMA and reviewed by me for thoroughness and accuracy.

## 2017-05-20 NOTE — Patient Instructions (Signed)

## 2017-05-28 ENCOUNTER — Encounter: Payer: Self-pay | Admitting: Physician Assistant

## 2017-05-28 ENCOUNTER — Ambulatory Visit: Payer: BC Managed Care – PPO | Admitting: Physician Assistant

## 2017-05-28 VITALS — BP 114/72 | HR 70 | Temp 98.7°F | Wt 138.0 lb

## 2017-05-28 DIAGNOSIS — J40 Bronchitis, not specified as acute or chronic: Secondary | ICD-10-CM | POA: Diagnosis not present

## 2017-05-28 MED ORDER — PREDNISONE 10 MG PO TABS: 10.0000 mg | ORAL_TABLET | Freq: Two times a day (BID) | ORAL | 0 refills | Status: AC

## 2017-05-28 MED ORDER — ALBUTEROL SULFATE HFA 108 (90 BASE) MCG/ACT IN AERS: 2.0000 | INHALATION_SPRAY | Freq: Four times a day (QID) | RESPIRATORY_TRACT | 2 refills | Status: DC | PRN

## 2017-05-28 NOTE — Progress Notes (Signed)
Patient: Pamela Taylor Female    DOB: 05-Oct-1965   52 y.o.   MRN: [id number] Visit Date: 05/28/2017  Today's Provider: Trey SailorsAdriana M Pollak, PA-C   No chief complaint on file.  Subjective:    Pamela Taylor is a 52 y/o woman seen in this clinic last week with influenza A following up for wheezing. She has completed 5 day course of Tamiflu. Her fever has resolved and she feels overall better but she has noticed some cough, SOB, and wheezing. Denies productive cough.  Influenza  This is a new problem. The current episode started in the past 7 days. The problem has been gradually improving. Associated symptoms include congestion, coughing and fatigue. Pertinent negatives include no abdominal pain, chills, diaphoresis, fever, headaches, nausea, neck pain, numbness, sore throat, swollen glands, vertigo, visual change, vomiting or weakness. She has tried nothing for the symptoms.       No Known Allergies   Current Outpatient Medications:  .  ALPRAZolam (XANAX) 0.5 MG tablet, Take 1 tablet (0.5 mg total) by mouth daily as needed for anxiety., Disp: 30 tablet, Rfl: 5 .  PARoxetine (PAXIL) 20 MG tablet, Take 1 tablet (20 mg total) by mouth daily., Disp: 90 tablet, Rfl: 1 .  valACYclovir (VALTREX) 1000 MG tablet, Take 1 tablet (1,000 mg total) by mouth daily. As needed for cold sores., Disp: 30 tablet, Rfl: 5  Review of Systems  Constitutional: Positive for fatigue. Negative for activity change, appetite change, chills, diaphoresis, fever and unexpected weight change.  HENT: Positive for congestion. Negative for sore throat.   Respiratory: Positive for cough, chest tightness, shortness of breath and wheezing. Negative for apnea, choking and stridor.   Gastrointestinal: Negative.  Negative for abdominal pain, nausea and vomiting.  Musculoskeletal: Negative for neck pain.  Neurological: Negative for dizziness, vertigo, weakness, light-headedness, numbness and headaches.    Social  History   Tobacco Use  . Smoking status: Never Smoker  . Smokeless tobacco: Never Used  Substance Use Topics  . Alcohol use: Yes    Comment: Occasional    Objective:   BP 114/72 (BP Location: Right Arm, Patient Position: Sitting, Cuff Size: Normal)   Pulse 70   Temp 98.7 F (37.1 C) (Oral)   Wt 138 lb (62.6 kg)   SpO2 98%   BMI 24.45 kg/m  Vitals:   05/28/17 1637  BP: 114/72  Pulse: 70  Temp: 98.7 F (37.1 C)  TempSrc: Oral  SpO2: 98%  Weight: 138 lb (62.6 kg)     Physical Exam  Constitutional: She is oriented to person, place, and time. She appears well-developed and well-nourished. She does not appear ill.  HENT:  Right Ear: External ear normal.  Left Ear: External ear normal.  Mouth/Throat: Oropharynx is clear and moist. No oropharyngeal exudate.  Eyes: Conjunctivae are normal.  Neck: Neck supple.  Cardiovascular: Normal rate and regular rhythm.  Pulmonary/Chest: Effort normal. No respiratory distress. She has wheezes. She has no rales.  Some slight expiratory wheezes and rhonchi that clear with coughing.   Lymphadenopathy:    She has no cervical adenopathy.  Neurological: She is alert and oriented to person, place, and time.  Skin: Skin is warm and dry.  Psychiatric: She has a normal mood and affect. Her behavior is normal.        Assessment & Plan:     1. Bronchitis  Discussed with patient course of treatment for bronchitis, likely 2/2 influenza. Not overly suspicious of pneumonia based  on clinical presentation and lung sounds but if patient has recurrence of fever or worsening of symptoms, I would like her to call us and we can order CXR to determine need for abx.   - albuterol (PROVENTIL HFA;VENTOLIN HFA) 108 (90 Base) MCG/ACT inhaler; Inhale 2 puffs into the lungs every 6 (six) hours as needed for wheezing or shortness of breath.  Dispense: 1 Inhaler; Refill: 2 - predniSONE (DELTASONE) 10 MG tablet; Take 1 tablet (10 mg total) by mouth 2 (two) times  daily with a meal for 5 days.  Dispense: 10 tablet; Refill: 0  Return if symptoms worsen or fail to improve.  The entirety of the information documented in the History of Present Illness, Review of Systems and Physical Exam were personally obtained by me. Portions of this information were initially documented by Kavin Leech, CMA and reviewed by me for thoroughness and accuracy.        Trey Sailors, PA-C  [facility]

## 2017-05-28 NOTE — Patient Instructions (Signed)

## 2017-08-20 ENCOUNTER — Other Ambulatory Visit: Payer: Self-pay | Admitting: Physician Assistant

## 2017-08-20 DIAGNOSIS — B001 Herpesviral vesicular dermatitis: Secondary | ICD-10-CM

## 2017-10-20 ENCOUNTER — Other Ambulatory Visit: Payer: Self-pay | Admitting: Physician Assistant

## 2017-10-20 DIAGNOSIS — F419 Anxiety disorder, unspecified: Secondary | ICD-10-CM

## 2017-10-20 MED ORDER — ALPRAZOLAM 0.5 MG PO TABS: 0.5000 mg | ORAL_TABLET | Freq: Every day | ORAL | 5 refills | Status: DC | PRN

## 2017-10-20 NOTE — Telephone Encounter (Signed)
Pt contacted office for refill request on the following medications:  ALPRAZolam (XANAX) 0.5 MG tablet  [facility] [address]  Last Rx: 04/03/17 with 5 refills LOV: 05/28/17 Please advise. Thanks TNP

## 2017-10-20 NOTE — Telephone Encounter (Signed)
Patient needs CPE. No CPE since 2017

## 2017-10-21 NOTE — Telephone Encounter (Signed)
lmtcb

## 2017-10-23 ENCOUNTER — Ambulatory Visit: Payer: BC Managed Care – PPO | Admitting: Physician Assistant

## 2017-10-23 NOTE — Progress Notes (Deleted)
       Patient: Pamela Taylor Female    DOB: 04-Jul-1965   52 y.o.   MRN: [id number] Visit Date: 10/23/2017  Today's Provider: Margaretann LovelessJennifer M Burnette, PA-C   No chief complaint on file.  Subjective:    HPI     No Known Allergies   Current Outpatient Medications:  .  albuterol (PROVENTIL HFA;VENTOLIN HFA) 108 (90 Base) MCG/ACT inhaler, Inhale 2 puffs into the lungs every 6 (six) hours as needed for wheezing or shortness of breath., Disp: 1 Inhaler, Rfl: 2 .  ALPRAZolam (XANAX) 0.5 MG tablet, Take 1 tablet (0.5 mg total) by mouth daily as needed for anxiety., Disp: 30 tablet, Rfl: 5 .  PARoxetine (PAXIL) 20 MG tablet, Take 1 tablet (20 mg total) by mouth daily., Disp: 90 tablet, Rfl: 1 .  valACYclovir (VALTREX) 1000 MG tablet, TAKE 1 TABLET (1,000 MG TOTAL) BY MOUTH DAILY. AS NEEDED FOR COLD SORES., Disp: 90 tablet, Rfl: 1  Review of Systems  Social History   Tobacco Use  . Smoking status: Never Smoker  . Smokeless tobacco: Never Used  Substance Use Topics  . Alcohol use: Yes    Comment: Occasional    Objective:   There were no vitals taken for this visit. There were no vitals filed for this visit.   Physical Exam      Assessment & Plan:           Margaretann LovelessJennifer M Burnette, PA-C  [facility]

## 2017-11-18 ENCOUNTER — Other Ambulatory Visit: Payer: Self-pay

## 2017-11-18 ENCOUNTER — Ambulatory Visit (INDEPENDENT_AMBULATORY_CARE_PROVIDER_SITE_OTHER): Payer: BC Managed Care – PPO | Admitting: Physician Assistant

## 2017-11-18 ENCOUNTER — Encounter: Payer: Self-pay | Admitting: Physician Assistant

## 2017-11-18 ENCOUNTER — Other Ambulatory Visit (HOSPITAL_COMMUNITY)
Admission: RE | Admit: 2017-11-18 | Discharge: 2017-11-18 | Disposition: A | Discharge status: Home / Self Care | Payer: BC Managed Care – PPO | Source: Ambulatory Visit | Attending: Physician Assistant | Admitting: Physician Assistant

## 2017-11-18 VITALS — BP 128/70 | HR 62 | Temp 98.0°F | Resp 16 | Ht 63.0 in | Wt 136.4 lb

## 2017-11-18 DIAGNOSIS — Z124 Encounter for screening for malignant neoplasm of cervix: Secondary | ICD-10-CM

## 2017-11-18 DIAGNOSIS — Z1231 Encounter for screening mammogram for malignant neoplasm of breast: Secondary | ICD-10-CM

## 2017-11-18 DIAGNOSIS — Z0000 Encounter for general adult medical examination without abnormal findings: Secondary | ICD-10-CM

## 2017-11-18 DIAGNOSIS — Z23 Encounter for immunization: Secondary | ICD-10-CM | POA: Diagnosis not present

## 2017-11-18 DIAGNOSIS — Z1159 Encounter for screening for other viral diseases: Secondary | ICD-10-CM

## 2017-11-18 DIAGNOSIS — F419 Anxiety disorder, unspecified: Secondary | ICD-10-CM | POA: Diagnosis not present

## 2017-11-18 DIAGNOSIS — E7800 Pure hypercholesterolemia, unspecified: Secondary | ICD-10-CM | POA: Diagnosis not present

## 2017-11-18 DIAGNOSIS — Z114 Encounter for screening for human immunodeficiency virus [HIV]: Secondary | ICD-10-CM

## 2017-11-18 MED ORDER — ALPRAZOLAM 0.5 MG PO TABS: 0.5000 mg | ORAL_TABLET | Freq: Every evening | ORAL | 1 refills | Status: DC | PRN

## 2017-11-18 NOTE — Progress Notes (Signed)
Patient: Pamela Taylor, Female    DOB: Oct 27, 1965, 52 y.o.   MRN: [id number] Visit Date: 11/18/2017  Today's Provider: Margaretann Loveless, PA-C   Chief Complaint  Patient presents with  . Annual Exam   Subjective:    Annual physical exam Pamela Taylor is a 52 y.o. female who presents today for health maintenance and complete physical. She feels well. She reports exercising. She reports she is sleeping well.  Last CPE:09/06/15 Pap:07/20/14- Negative Mammogram:12/20/15 [facility] given at [facility] is in her med list 12/11/16 -----------------------------------------------------------------   Review of Systems  Constitutional: Negative.   HENT: Negative.   Eyes: Negative.   Respiratory: Negative.   Cardiovascular: Negative.   Gastrointestinal: Negative.   Endocrine: Negative.   Genitourinary: Negative.   Musculoskeletal: Negative.   Skin: Negative.   Allergic/Immunologic: Negative.   Neurological: Negative.   Hematological: Negative.   Psychiatric/Behavioral: Negative.     Social History      She  reports that she has never smoked. She has never used smokeless tobacco. She reports that she drinks alcohol. She reports that she does not use drugs.       Social History   Socioeconomic History  . Marital status: Married    Spouse name: Not on file  . Number of children: Not on file  . Years of education: Not on file  . Highest education level: Not on file  Occupational History  . Not on file  Social Needs  . Financial resource strain: Not on file  . Food insecurity:    Worry: Not on file    Inability: Not on file  . Transportation needs:    Medical: Not on file    Non-medical: Not on file  Tobacco Use  . Smoking status: Never Smoker  . Smokeless tobacco: Never Used  Substance and Sexual Activity  . Alcohol use: Yes    Comment: Occasional   . Drug use: No  . Sexual activity: Not on file  Lifestyle  . Physical activity:    Days per week:  Not on file    Minutes per session: Not on file  . Stress: Not on file  Relationships  . Social connections:    Talks on phone: Not on file    Gets together: Not on file    Attends religious service: Not on file    Active member of club or organization: Not on file    Attends meetings of clubs or organizations: Not on file    Relationship status: Not on file  Other Topics Concern  . Not on file  Social History Narrative  . Not on file    Past Medical History:  Diagnosis Date  . Anemia 05/17/2009  . Depression   . Hyperlipidemia      Patient Active Problem List   Diagnosis Date Noted  . Difficulty hearing 09/05/2015  . Pain in soft tissues of limb 09/05/2015  . 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 Acute anxiety 12/21/2014  . Anemia, iron deficiency 05/17/2009  . Hypercholesterolemia without hypertriglyceridemia 05/13/2009  . Clinical depression 04/27/2009  . Dermatologic disease 04/27/2009  . Herpes 04/27/2009    Past Surgical History:  Procedure Laterality Date  . BTL  1993  . CESAREAN SECTION  [phone]  . REFRACTIVE SURGERY Bilateral 2003    Family History        Family Status  Relation Name Status  . Father  (Not Specified)  substance abuse, alcoholism in revocery. Early Dementia, CHF. No previous AMI; emphysema  . Sister  Alive  . Brother  Alive  . Mother  (Not Specified)        Her family history includes Breast cancer (age of onset: 80) in her mother; Cancer (age of onset: 77) in her mother; Depression in her father.      No Known Allergies   Current Outpatient Medications:  .  albuterol (PROVENTIL HFA;VENTOLIN HFA) 108 (90 Base) MCG/ACT inhaler, Inhale 2 puffs into the lungs every 6 (six) hours as needed for wheezing or shortness of breath., Disp: 1 Inhaler, Rfl: 2 .  ALPRAZolam (XANAX) 0.5 MG tablet, Take 1 tablet (0.5 mg total) by mouth at bedtime as needed for anxiety., Disp: 90 tablet, Rfl:  1 .  PARoxetine (PAXIL) 20 MG tablet, Take 1 tablet (20 mg total) by mouth daily., Disp: 90 tablet, Rfl: 1 .  valACYclovir (VALTREX) 1000 MG tablet, TAKE 1 TABLET (1,000 MG TOTAL) BY MOUTH DAILY. AS NEEDED FOR COLD SORES., Disp: 90 tablet, Rfl: 1 .  Zoster Vaccine Adjuvanted (SHINGRIX) injection, TO BE ADMINISTERED BY PHARMACIST FOR IMMUNIZATION, Disp: , Rfl:    Patient Care Team: Margaretann Loveless, PA-C as PCP - General (Family Medicine)      Objective:   Vitals: BP 128/70 (BP Location: Left Arm, Patient Position: Sitting, Cuff Size: Normal)   Pulse 62   Temp 98 F (36.7 C) (Oral)   Resp 16   Ht 5\' 3"  (1.6 m)   Wt 136 lb 6.4 oz (61.9 kg)   BMI 24.16 kg/m    Vitals:   11/18/17 1509  BP: 128/70  Pulse: 62  Resp: 16  Temp: 98 F (36.7 C)  TempSrc: Oral  Weight: 136 lb 6.4 oz (61.9 kg)  Height: 5\' 3"  (1.6 m)     Physical Exam  Constitutional: She is oriented to person, place, and time. She appears well-developed and well-nourished. No distress.  HENT:  Head: Normocephalic and atraumatic.  Right Ear: Hearing, tympanic membrane, external ear and ear canal normal.  Left Ear: Hearing, tympanic membrane, external ear and ear canal normal.  Nose: Nose normal.  Mouth/Throat: Uvula is midline, oropharynx is clear and moist and mucous membranes are normal. No oropharyngeal exudate.  Eyes: Pupils are equal, round, and reactive to light. Conjunctivae and EOM are normal. Right eye exhibits no discharge. Left eye exhibits no discharge. No scleral icterus.  Neck: Normal range of motion. Neck supple. No JVD present. Carotid bruit is not present. No tracheal deviation present. No thyromegaly present.  Cardiovascular: Normal rate, regular rhythm, normal heart sounds and intact distal pulses. Exam reveals no gallop and no friction rub.  No murmur heard. Pulmonary/Chest: Effort normal and breath sounds normal. No respiratory distress. She has no wheezes. She has no rales. She exhibits no  tenderness. Right breast exhibits no inverted nipple, no mass, no nipple discharge, no skin change and no tenderness. Left breast exhibits no inverted nipple, no mass, no nipple discharge, no skin change and no tenderness. No breast tenderness, discharge or bleeding. Breasts are symmetrical.  Abdominal: Soft. Bowel sounds are normal. She exhibits no distension and no mass. There is no tenderness. There is no rebound and no guarding. Hernia confirmed negative in the right inguinal area and confirmed negative in the left inguinal area.  Genitourinary: Rectum normal, vagina normal and uterus normal. No breast tenderness, discharge or bleeding. Pelvic exam was performed with patient supine. There is no rash, tenderness, lesion  or injury on the right labia. There is no rash, tenderness, lesion or injury on the left labia. Cervix exhibits no motion tenderness, no discharge and no friability. Right adnexum displays no mass, no tenderness and no fullness. Left adnexum displays no mass, no tenderness and no fullness. No erythema, tenderness or bleeding in the vagina. No signs of injury around the vagina. No vaginal discharge found.  Musculoskeletal: Normal range of motion. She exhibits no edema or tenderness.  Lymphadenopathy:    She has no cervical adenopathy.       Right: No inguinal adenopathy present.       Left: No inguinal adenopathy present.  Neurological: She is alert and oriented to person, place, and time. She has normal reflexes. No cranial nerve deficit. Coordination normal.  Skin: Skin is warm and dry. No rash noted. She is not diaphoretic.  Psychiatric: She has a normal mood and affect. Her behavior is normal. Judgment and thought content normal.  Vitals reviewed.    Depression Screen PHQ 2/9 Scores 09/06/2015  PHQ - 2 Score 0      Assessment & Plan:     Routine Health Maintenance and Physical Exam  Exercise Activities and Dietary recommendations Goals    None      Immunization  History  Administered Date(s) Administered  . Influenza,inj,Quad PF,6+ Mos 01/05/2016  . Influenza-Unspecified 02/13/2017    Health Maintenance  Topic Date Due  . Hepatitis C Screening  1965/12/12  . HIV Screening  04/29/1978  . TETANUS/TDAP  04/29/1982  . PAP SMEAR  07/24/2017  . INFLUENZA VACCINE  11/13/2017  . MAMMOGRAM  12/19/2017  . COLONOSCOPY  09/14/2023     Discussed health benefits of physical activity, and encouraged her to engage in regular exercise appropriate for her age and condition.    1. Annual physical exam Normal physical exam today. Will check labs as below and f/u pending lab results. If labs are stable and WNL she will not need to have these rechecked for one year at her next annual physical exam. She is to call the office in the meantime if she has any acute issue, questions or concerns. - CBC with Differential/Platelet - Comprehensive metabolic panel - Hemoglobin A1c - TSH  2. Acute anxiety Stable. Diagnosis pulled for medication refill. Continue current medical treatment plan. - ALPRAZolam (XANAX) 0.5 MG tablet; Take 1 tablet (0.5 mg total) by mouth at bedtime as needed for anxiety.  Dispense: 90 tablet; Refill: 1  3. Hypercholesterolemia without hypertriglyceridemia Diet controlled. Will check labs as below and f/u pending results. - Lipid panel  4. Encounter for screening mammogram for breast cancer Normal mammogram. Repeat screening in one year. - MM DIGITAL SCREENING BILATERAL; Future  5. Encounter for screening for HIV - HIV antibody  6. Need for hepatitis C screening test - Hepatitis C antibody  7. Encounter for screening for cervical cancer  Pap collected today. Will send as below and f/u pending results. - Cytology - PAP  8. Need for Tdap vaccination Tdap Vaccine given to patient without complications. Patient sat for 15 minutes after administration and was tolerated well without adverse effects. - Tdap vaccine greater than or equal  to 7yo IM  --------------------------------------------------------------------    Margaretann LovelessJennifer M Burnette, PA-C  [facility]

## 2017-11-18 NOTE — Patient Instructions (Signed)

## 2017-11-19 ENCOUNTER — Telehealth: Payer: Self-pay

## 2017-11-19 LAB — COMPREHENSIVE METABOLIC PANEL
ALT: 24 IU/L (ref 0–32)
AST: 25 IU/L (ref 0–40)
Albumin/Globulin Ratio: 1.6 (ref 1.2–2.2)
Albumin: 4.2 g/dL (ref 3.5–5.5)
Alkaline Phosphatase: 70 IU/L (ref 39–117)
BUN/Creatinine Ratio: 18 (ref 9–23)
BUN: 14 mg/dL (ref 6–24)
Bilirubin Total: 0.4 mg/dL (ref 0.0–1.2)
CO2: 23 mmol/L (ref 20–29)
Calcium: 9.3 mg/dL (ref 8.7–10.2)
Chloride: 104 mmol/L (ref 96–106)
Creatinine, Ser: 0.78 mg/dL (ref 0.57–1.00)
GFR calc non Af Amer: 86 mL/min/{1.73_m2} (ref >59)
GFR, EST AFRICAN AMERICAN: 100 mL/min/{1.73_m2} (ref >59)
Globulin, Total: 2.6 g/dL (ref 1.5–4.5)
Glucose: 79 mg/dL (ref 65–99)
Potassium: 3.9 mmol/L (ref 3.5–5.2)
Sodium: 140 mmol/L (ref 134–144)
TOTAL PROTEIN: 6.8 g/dL (ref 6.0–8.5)

## 2017-11-19 LAB — CBC WITH DIFFERENTIAL/PLATELET
BASOS: 1 %
Basophils Absolute: 0 10*3/uL (ref 0.0–0.2)
EOS (ABSOLUTE): 0.3 10*3/uL (ref 0.0–0.4)
Eos: 4 %
Hematocrit: 39.5 % (ref 34.0–46.6)
Hemoglobin: 13.5 g/dL (ref 11.1–15.9)
IMMATURE GRANS (ABS): 0 10*3/uL (ref 0.0–0.1)
IMMATURE GRANULOCYTES: 0 %
LYMPHS: 33 %
Lymphocytes Absolute: 2.4 10*3/uL (ref 0.7–3.1)
MCH: 31 pg (ref 26.6–33.0)
MCHC: 34.2 g/dL (ref 31.5–35.7)
MCV: 91 fL (ref 79–97)
Monocytes Absolute: 0.5 10*3/uL (ref 0.1–0.9)
Monocytes: 7 %
Neutrophils Absolute: 4 10*3/uL (ref 1.4–7.0)
Neutrophils: 55 %
PLATELETS: 265 10*3/uL (ref 150–450)
RBC: 4.35 x10E6/uL (ref 3.77–5.28)
RDW: 13 % (ref 12.3–15.4)
WBC: 7.3 10*3/uL (ref 3.4–10.8)

## 2017-11-19 LAB — LIPID PANEL
CHOLESTEROL TOTAL: 225 mg/dL — AB (ref 100–199)
Chol/HDL Ratio: 4.1 ratio (ref 0.0–4.4)
HDL: 55 mg/dL (ref >39)
LDL CALC: 146 mg/dL — AB (ref 0–99)
TRIGLYCERIDES: 122 mg/dL (ref 0–149)
VLDL CHOLESTEROL CAL: 24 mg/dL (ref 5–40)

## 2017-11-19 LAB — HEMOGLOBIN A1C
Est. average glucose Bld gHb Est-mCnc: 97 mg/dL
Hgb A1c MFr Bld: 5 % (ref 4.8–5.6)

## 2017-11-19 LAB — HEPATITIS C ANTIBODY: Hep C Virus Ab: <0.1 s/co ratio (ref 0.0–0.9)

## 2017-11-19 LAB — HIV ANTIBODY (ROUTINE TESTING W REFLEX): HIV SCREEN 4TH GENERATION: NONREACTIVE

## 2017-11-19 LAB — TSH: TSH: 3.21 u[IU]/mL (ref 0.450–4.500)

## 2017-11-19 NOTE — Telephone Encounter (Signed)
Patient advised as directed below.  Thanks,  -Elridge Stemm 

## 2017-11-19 NOTE — Telephone Encounter (Signed)
-----   Message from Margaretann LovelessJennifer M Burnette, [place]PA-C sent at 11/19/2017 10:04 AM EDT ----- All labs are within normal limits and stable.  Thanks! -JB

## 2017-11-20 ENCOUNTER — Telehealth: Payer: Self-pay

## 2017-11-20 LAB — CYTOLOGY - PAP
DIAGNOSIS: NEGATIVE
HPV: NOT DETECTED

## 2017-11-20 NOTE — Telephone Encounter (Signed)
-----   Message from Margaretann LovelessJennifer M Burnette, [place]PA-C sent at 11/20/2017  3:13 PM EDT ----- Pap is normal, HPV negative.  Will repeat in 3-5 years.

## 2017-11-20 NOTE — Telephone Encounter (Signed)
Patient advised as directed below.  Thanks,  -Joseline 

## 2018-02-11 ENCOUNTER — Telehealth: Payer: Self-pay | Admitting: Physician Assistant

## 2018-02-11 DIAGNOSIS — F411 Generalized anxiety disorder: Secondary | ICD-10-CM

## 2018-02-11 NOTE — Telephone Encounter (Signed)
Pt requesting refill on: PARoxetine (PAXIL) 20 MG tablet  Please fill at:  [id number] [place], [place] - [address] [phone] (Phone) [phone] (Fax)    Thanks, [facility]

## 2018-02-12 MED ORDER — PAROXETINE HCL 20 MG PO TABS: 20.0000 mg | ORAL_TABLET | Freq: Every day | ORAL | 4 refills | Status: DC

## 2018-02-12 NOTE — Telephone Encounter (Signed)
Jenni's patient.  LOV:11/18/17

## 2018-05-11 ENCOUNTER — Inpatient Hospital Stay: Payer: BC Managed Care – PPO

## 2018-05-11 ENCOUNTER — Emergency Department: Payer: BC Managed Care – PPO

## 2018-05-11 ENCOUNTER — Encounter: Payer: Self-pay | Admitting: Radiology

## 2018-05-11 ENCOUNTER — Inpatient Hospital Stay
Admission: EM | Admit: 2018-05-11 | Discharge: 2018-05-14 | DRG: 373 | Disposition: A | Discharge status: Home / Self Care | Payer: BC Managed Care – PPO | Attending: General Surgery | Admitting: General Surgery

## 2018-05-11 ENCOUNTER — Other Ambulatory Visit: Payer: Self-pay

## 2018-05-11 DIAGNOSIS — R1031 Right lower quadrant pain: Secondary | ICD-10-CM

## 2018-05-11 DIAGNOSIS — K3533 Acute appendicitis with perforation and localized peritonitis, with abscess: Secondary | ICD-10-CM | POA: Diagnosis present

## 2018-05-11 DIAGNOSIS — E785 Hyperlipidemia, unspecified: Secondary | ICD-10-CM | POA: Diagnosis present

## 2018-05-11 DIAGNOSIS — K3532 Acute appendicitis with perforation and localized peritonitis, without abscess: Secondary | ICD-10-CM

## 2018-05-11 DIAGNOSIS — F329 Major depressive disorder, single episode, unspecified: Secondary | ICD-10-CM | POA: Diagnosis present

## 2018-05-11 DIAGNOSIS — Z79899 Other long term (current) drug therapy: Secondary | ICD-10-CM | POA: Diagnosis not present

## 2018-05-11 DIAGNOSIS — K381 Appendicular concretions: Secondary | ICD-10-CM | POA: Diagnosis present

## 2018-05-11 DIAGNOSIS — R112 Nausea with vomiting, unspecified: Secondary | ICD-10-CM

## 2018-05-11 DIAGNOSIS — Z818 Family history of other mental and behavioral disorders: Secondary | ICD-10-CM

## 2018-05-11 LAB — CBC WITH DIFFERENTIAL/PLATELET
Abs Immature Granulocytes: 0.03 10*3/uL (ref 0.00–0.07)
Basophils Absolute: 0 10*3/uL (ref 0.0–0.1)
Basophils Relative: 0 %
EOS ABS: 0.1 10*3/uL (ref 0.0–0.5)
Eosinophils Relative: 1 %
HEMATOCRIT: 37.6 % (ref 36.0–46.0)
Hemoglobin: 12.6 g/dL (ref 12.0–15.0)
Immature Granulocytes: 0 %
Lymphocytes Relative: 14 %
Lymphs Abs: 1.5 10*3/uL (ref 0.7–4.0)
MCH: 30.9 pg (ref 26.0–34.0)
MCHC: 33.5 g/dL (ref 30.0–36.0)
MCV: 92.2 fL (ref 80.0–100.0)
Monocytes Absolute: 0.9 10*3/uL (ref 0.1–1.0)
Monocytes Relative: 9 %
Neutro Abs: 8.1 10*3/uL — ABNORMAL HIGH (ref 1.7–7.7)
Neutrophils Relative %: 76 %
Platelets: 295 10*3/uL (ref 150–400)
RBC: 4.08 MIL/uL (ref 3.87–5.11)
RDW: 12 % (ref 11.5–15.5)
WBC: 10.7 10*3/uL — ABNORMAL HIGH (ref 4.0–10.5)
nRBC: 0 % (ref 0.0–0.2)

## 2018-05-11 LAB — COMPREHENSIVE METABOLIC PANEL
ALT: 90 U/L — ABNORMAL HIGH (ref 0–44)
ANION GAP: 9 (ref 5–15)
AST: 109 U/L — ABNORMAL HIGH (ref 15–41)
Albumin: 3.8 g/dL (ref 3.5–5.0)
Alkaline Phosphatase: 139 U/L — ABNORMAL HIGH (ref 38–126)
BUN: 8 mg/dL (ref 6–20)
CO2: 25 mmol/L (ref 22–32)
Calcium: 8.8 mg/dL — ABNORMAL LOW (ref 8.9–10.3)
Chloride: 104 mmol/L (ref 98–111)
Creatinine, Ser: 0.64 mg/dL (ref 0.44–1.00)
GFR calc Af Amer: >60 mL/min (ref >60)
GFR calc non Af Amer: >60 mL/min (ref >60)
Glucose, Bld: 168 mg/dL — ABNORMAL HIGH (ref 70–99)
Potassium: 3.8 mmol/L (ref 3.5–5.1)
Sodium: 138 mmol/L (ref 135–145)
Total Bilirubin: 0.8 mg/dL (ref 0.3–1.2)
Total Protein: 7.5 g/dL (ref 6.5–8.1)

## 2018-05-11 LAB — URINALYSIS, COMPLETE (UACMP) WITH MICROSCOPIC
BILIRUBIN URINE: NEGATIVE
Bacteria, UA: NONE SEEN
Glucose, UA: NEGATIVE mg/dL
Ketones, ur: 5 mg/dL — AB
LEUKOCYTES UA: NEGATIVE
Nitrite: NEGATIVE
Protein, ur: NEGATIVE mg/dL
Specific Gravity, Urine: 1.012 (ref 1.005–1.030)
pH: 5 (ref 5.0–8.0)

## 2018-05-11 LAB — APTT: aPTT: 26 seconds (ref 24–36)

## 2018-05-11 LAB — PREGNANCY, URINE: Preg Test, Ur: NEGATIVE

## 2018-05-11 LAB — LIPASE, BLOOD: Lipase: 23 U/L (ref 11–51)

## 2018-05-11 LAB — PROTIME-INR
INR: 1.05
Prothrombin Time: 13.6 seconds (ref 11.4–15.2)

## 2018-05-11 MED ORDER — SODIUM CHLORIDE 0.9% FLUSH
5.0000 mL | Freq: Three times a day (TID) | INTRAVENOUS | Status: DC
  Administered 2018-05-11 – 2018-05-14 (×9): 5 mL

## 2018-05-11 MED ORDER — PIPERACILLIN-TAZOBACTAM 3.375 G IVPB
3.3750 g | Freq: Three times a day (TID) | INTRAVENOUS | Status: DC
  Administered 2018-05-11 – 2018-05-14 (×9): 3.375 g via INTRAVENOUS
  Filled 2018-05-11 (×9): qty 50

## 2018-05-11 MED ORDER — ONDANSETRON 4 MG PO TBDP: 4.0000 mg | ORAL_TABLET | Freq: Four times a day (QID) | ORAL | Status: DC | PRN

## 2018-05-11 MED ORDER — MORPHINE SULFATE (PF) 4 MG/ML IV SOLN
INTRAVENOUS | Status: AC
  Filled 2018-05-11: qty 1

## 2018-05-11 MED ORDER — MIDAZOLAM HCL 5 MG/5ML IJ SOLN
INTRAMUSCULAR | Status: AC | PRN
  Administered 2018-05-11: 1 mg via INTRAVENOUS
  Administered 2018-05-11 (×3): 0.5 mg via INTRAVENOUS

## 2018-05-11 MED ORDER — MORPHINE SULFATE (PF) 4 MG/ML IV SOLN
4.0000 mg | INTRAVENOUS | Status: DC | PRN
  Administered 2018-05-11 (×3): 4 mg via INTRAVENOUS
  Filled 2018-05-11 (×3): qty 1

## 2018-05-11 MED ORDER — ACETAMINOPHEN 325 MG PO TABS
650.0000 mg | ORAL_TABLET | Freq: Four times a day (QID) | ORAL | Status: DC | PRN
  Administered 2018-05-11: 650 mg via ORAL
  Filled 2018-05-11: qty 2

## 2018-05-11 MED ORDER — ONDANSETRON HCL 4 MG/2ML IJ SOLN: 4.0000 mg | Freq: Four times a day (QID) | INTRAMUSCULAR | Status: DC | PRN

## 2018-05-11 MED ORDER — IOHEXOL 300 MG/ML  SOLN
75.0000 mL | Freq: Once | INTRAMUSCULAR | Status: AC | PRN
  Administered 2018-05-11: 75 mL via INTRAVENOUS

## 2018-05-11 MED ORDER — FENTANYL CITRATE (PF) 100 MCG/2ML IJ SOLN
INTRAMUSCULAR | Status: AC
  Filled 2018-05-11: qty 4

## 2018-05-11 MED ORDER — FENTANYL CITRATE (PF) 100 MCG/2ML IJ SOLN
INTRAMUSCULAR | Status: AC | PRN
  Administered 2018-05-11: 25 ug via INTRAVENOUS
  Administered 2018-05-11: 50 ug via INTRAVENOUS

## 2018-05-11 MED ORDER — KETOROLAC TROMETHAMINE 30 MG/ML IJ SOLN
10.0000 mg | INTRAMUSCULAR | Status: AC
  Administered 2018-05-11: 9.9 mg via INTRAVENOUS
  Filled 2018-05-11: qty 1

## 2018-05-11 MED ORDER — MORPHINE SULFATE (PF) 4 MG/ML IV SOLN
4.0000 mg | Freq: Once | INTRAVENOUS | Status: AC
  Administered 2018-05-11: 4 mg via INTRAVENOUS
  Filled 2018-05-11: qty 1

## 2018-05-11 MED ORDER — FAMOTIDINE IN NACL 20-0.9 MG/50ML-% IV SOLN
20.0000 mg | Freq: Two times a day (BID) | INTRAVENOUS | Status: DC
  Administered 2018-05-11 – 2018-05-14 (×7): 20 mg via INTRAVENOUS
  Filled 2018-05-11 (×7): qty 50

## 2018-05-11 MED ORDER — SODIUM CHLORIDE 0.9 % IV BOLUS
1000.0000 mL | Freq: Once | INTRAVENOUS | Status: AC
  Administered 2018-05-11: 1000 mL via INTRAVENOUS

## 2018-05-11 MED ORDER — ONDANSETRON HCL 4 MG/2ML IJ SOLN
4.0000 mg | Freq: Once | INTRAMUSCULAR | Status: AC
  Administered 2018-05-11: 4 mg via INTRAVENOUS
  Filled 2018-05-11: qty 2

## 2018-05-11 MED ORDER — ALPRAZOLAM 0.5 MG PO TABS
0.5000 mg | ORAL_TABLET | Freq: Every evening | ORAL | Status: DC | PRN
  Administered 2018-05-11: 0.5 mg via ORAL
  Filled 2018-05-11: qty 1

## 2018-05-11 MED ORDER — MIDAZOLAM HCL 5 MG/5ML IJ SOLN
INTRAMUSCULAR | Status: AC
  Filled 2018-05-11: qty 5

## 2018-05-11 MED ORDER — PAROXETINE HCL 20 MG PO TABS
20.0000 mg | ORAL_TABLET | Freq: Every day | ORAL | Status: DC
  Administered 2018-05-12 – 2018-05-14 (×3): 20 mg via ORAL
  Filled 2018-05-11 (×4): qty 1

## 2018-05-11 MED ORDER — PIPERACILLIN-TAZOBACTAM 3.375 G IVPB 30 MIN
3.3750 g | Freq: Once | INTRAVENOUS | Status: AC
  Administered 2018-05-11: 3.375 g via INTRAVENOUS
  Filled 2018-05-11: qty 50

## 2018-05-11 MED ORDER — SODIUM CHLORIDE 0.9 % IV SOLN
INTRAVENOUS | Status: DC
  Administered 2018-05-11 – 2018-05-14 (×6): via INTRAVENOUS

## 2018-05-11 MED ORDER — HYDROCODONE-ACETAMINOPHEN 5-325 MG PO TABS
1.0000 | ORAL_TABLET | ORAL | Status: DC | PRN
  Administered 2018-05-11 – 2018-05-14 (×11): 2 via ORAL
  Filled 2018-05-11 (×11): qty 2

## 2018-05-11 MED ORDER — ACETAMINOPHEN 650 MG RE SUPP
650.0000 mg | Freq: Four times a day (QID) | RECTAL | Status: DC | PRN
  Administered 2018-05-11: 650 mg via RECTAL
  Filled 2018-05-11: qty 1

## 2018-05-11 MED ORDER — LIDOCAINE HCL (PF) 1 % IJ SOLN
INTRAMUSCULAR | Status: AC | PRN
  Administered 2018-05-11: 13 mL

## 2018-05-11 NOTE — Procedures (Signed)
Interventional Radiology Procedure Note  Procedure: CT Guided Drainage of Appendiceal Abscess  Complications: None  Estimated Blood Loss: < 10 mL  Findings: 10 Fr drain placed in periappendiceal with return of bloody, purulent fluid. Fluid sample sent for culture analysis. Drain attached to suction bulb drainage.  Will follow.  Jodi Marble. Fredia Sorrow, M.D Pager:  [phone]

## 2018-05-11 NOTE — ED Triage Notes (Signed)
Pt arrives with RLQ pain and vomiting. Pt a three person assist out of vehicle. Skin pale. Pt actively vomiting and alternating with "help me I don't feel good" during triage. Pt's spouse states pain has been present for a week and is getting worse.

## 2018-05-11 NOTE — ED Notes (Signed)
Discussed patient with surgeon on call.  Surgeon states he will be coming to assess patient shortly.  Patient and family updated.  Will continue to monitor.

## 2018-05-11 NOTE — ED Notes (Signed)
Patient transported to CT 

## 2018-05-11 NOTE — H&P (Signed)
SURGICAL HISTORY AND PHYSICAL NOTE   HISTORY OF PRESENT ILLNESS (HPI):  53 y.o. female [facility] ED for evaluation of abdominal pain. Patient reports having abdominal pain since a week ago. She thought that it was a pulled muscle, but with days it started to get worse. She tried taking more and more ibuprofen as pain got worse. She was out of town on the weekend and arrived yesterday, when she started vomiting and decided to come to the ED. Pain is mostly localized on the right lower quadrant and suprapubic area. No pain radiation. Aggravator facto is motion of the abdominal wall. No alleviator factor identified. Denies fever or chills.  At ED CT scan was done without contrast. I personally evaluated the images. The appendix is very wide (4cm) with severe peri appendicial stranding. There is suspected abscess. Since the CT scan was without contras this limits the evaluation and characterization of the abscess. There is minimal leukocytosis, no fever, no tachycardia. No free air or free fluid.   Surgery is consulted by Dr. Scotty CourtStafford in this context for evaluation and management of appendicitis.  PAST MEDICAL HISTORY (PMH):  Past Medical History:  Diagnosis Date  . Anemia 05/17/2009  . Depression   . Hyperlipidemia      PAST SURGICAL HISTORY (PSH):  Past Surgical History:  Procedure Laterality Date  . BTL  1993  . CESAREAN SECTION  [phone],1992,1993  . REFRACTIVE SURGERY Bilateral 2003     MEDICATIONS:  Prior to Admission medications   Medication Sig Start Date End Date Taking? Authorizing Provider  PARoxetine (PAXIL) 20 MG tablet Take 1 tablet (20 mg total) by mouth daily. 02/12/18  Yes Malva LimesFisher, Donald E, MD  albuterol (PROVENTIL HFA;VENTOLIN HFA) 108 (90 Base) MCG/ACT inhaler Inhale 2 puffs into the lungs every 6 (six) hours as needed for wheezing or shortness of breath. Patient not taking: Reported on 05/11/2018 05/28/17   Trey SailorsPollak, Adriana M, PA-C  ALPRAZolam Prudy Feeler(XANAX) 0.5 MG tablet  Take 1 tablet (0.5 mg total) by mouth at bedtime as needed for anxiety. 11/18/17   Margaretann LovelessBurnette, Jennifer M, PA-C  valACYclovir (VALTREX) 1000 MG tablet TAKE 1 TABLET (1,000 MG TOTAL) BY MOUTH DAILY. AS NEEDED FOR COLD SORES. 08/21/17   Margaretann LovelessBurnette, Jennifer M, PA-C     ALLERGIES:  No Known Allergies   SOCIAL HISTORY:  Social History   Socioeconomic History  . Marital status: Married    Spouse name: Not on file  . Number of children: Not on file  . Years of education: Not on file  . Highest education level: Not on file  Occupational History  . Not on file  Social Needs  . Financial resource strain: Not on file  . Food insecurity:    Worry: Not on file    Inability: Not on file  . Transportation needs:    Medical: Not on file    Non-medical: Not on file  Tobacco Use  . Smoking status: Never Smoker  . Smokeless tobacco: Never Used  Substance and Sexual Activity  . Alcohol use: Yes    Comment: Occasional   . Drug use: No  . Sexual activity: Not on file  Lifestyle  . Physical activity:    Days per week: Not on file    Minutes per session: Not on file  . Stress: Not on file  Relationships  . Social connections:    Talks on phone: Not on file    Gets together: Not on file    Attends religious service:  Not on file    Active member of club or organization: Not on file    Attends meetings of clubs or organizations: Not on file    Relationship status: Not on file  . Intimate partner violence:    Fear of current or ex partner: Not on file    Emotionally abused: Not on file    Physically abused: Not on file    Forced sexual activity: Not on file  Other Topics Concern  . Not on file  Social History Narrative  . Not on file    The patient currently resides (home / rehab facility / nursing home): Home The patient normally is (ambulatory / bedbound): Ambulatory   FAMILY HISTORY:  Family History  Problem Relation Age of Onset  . Depression Father   . Cancer Mother [id number]       Breast  cancer  . Breast cancer Mother [id number]     REVIEW OF SYSTEMS:  Constitutional: denies weight loss, fever, chills, or sweats  Eyes: denies any other vision changes, history of eye injury  ENT: denies sore throat, hearing problems  Respiratory: denies shortness of breath, wheezing  Cardiovascular: denies chest pain, palpitations  Gastrointestinal: positive for abdominal pain, nausea and vomiting. No diarrhea.  Genitourinary: denies burning with urination or urinary frequency Musculoskeletal: denies any other joint pains or cramps  Skin: denies any other rashes or skin discolorations  Neurological: denies any other headache, dizziness, weakness  Psychiatric: denies any other depression, anxiety   All other review of systems were negative   VITAL SIGNS:  Temp:  [98.1 F (36.7 C)-100.7 F (38.2 C)] 100.7 F (38.2 C) (01/27 0636) Pulse Rate:  [88] 88 (01/27 0446) Resp:  [18] 18 (01/27 0446) BP: (123)/(65) 123/65 (01/27 0446) SpO2:  [100 %] 100 % (01/27 0446) Weight:  [59 kg] 59 kg (01/27 0446)     Height: 5\' 2"  (157.5 cm)(information obtained via spouse) Weight: 59 kg BMI (Calculated): 23.77   INTAKE/OUTPUT:  This shift: No intake/output data recorded.  Last 2 shifts: @IOLAST2SHIFTS @   PHYSICAL EXAM:  Constitutional:  -- Normal body habitus  -- Awake, alert, and oriented x3  Eyes:  -- Pupils equally round and reactive to light  -- No scleral icterus  Ear, nose, and throat:  -- No jugular venous distension  Pulmonary:  -- No crackles  -- Equal breath sounds bilaterally -- Breathing non-labored at rest Cardiovascular:  -- S1, S2 present  -- No pericardial rubs Gastrointestinal:  -- Abdomen soft, non-distended. Severe localized tender on right lower quadrant with guarding and rebound tenderness -- No abdominal masses appreciated, pulsatile or otherwise  Musculoskeletal and Integumentary:  -- Wounds or skin discoloration: None appreciated -- Extremities: B/L UE and LE FROM,  hands and feet warm, no edema  Neurologic:  -- Motor function: intact and symmetric -- Sensation: intact and symmetric   Labs:  CBC Latest Ref Rng & Units 05/11/2018 11/18/2017 09/06/2015  WBC 4.0 - 10.5 K/uL 10.7(H) 7.3 8.6  Hemoglobin 12.0 - 15.0 g/dL 16.112.6 09.613.5 04.514.6  Hematocrit 36.0 - 46.0 % 37.6 39.5 41.7  Platelets 150 - 400 K/uL 295 265 250   CMP Latest Ref Rng & Units 05/11/2018 11/18/2017 09/06/2015  Glucose 70 - 99 mg/dL 409(W168(H) 79 82  BUN 6 - 20 mg/dL 8 14 9   Creatinine 0.44 - 1.00 mg/dL 1.190.64 1.470.78 8.290.66  Sodium 135 - 145 mmol/L 138 140 140  Potassium 3.5 - 5.1 mmol/L 3.8 3.9 4.5  Chloride 98 - 111  mmol/L 104 104 99  CO2 22 - 32 mmol/L 25 23 25   Calcium 8.9 - 10.3 mg/dL 4.0(J) 9.3 9.7  Total Protein 6.5 - 8.1 g/dL 7.5 6.8 7.2  Total Bilirubin 0.3 - 1.2 mg/dL 0.8 0.4 0.4  Alkaline Phos 38 - 126 U/L 139(H) 70 60  AST 15 - 41 U/L 109(H) 25 18  ALT 0 - 44 U/L 90(H) 24 16   Imaging studies:  EXAM: CT ABDOMEN AND PELVIS WITHOUT CONTRAST  TECHNIQUE: Multidetector CT imaging of the abdomen and pelvis was performed following the standard protocol without IV contrast.  COMPARISON:  None.  FINDINGS: Lower chest:  No contributory findings.  Hepatobiliary: No focal liver abnormality.No evidence of biliary obstruction or stone.  Pancreas: Unremarkable.  Spleen: Unremarkable. Possible 5 mm splenic artery aneurysm, non worrisome at this small size.  Adrenals/Urinary Tract: Negative adrenals. No hydronephrosis or stone. Unremarkable bladder.  Stomach/Bowel: The appendix is markedly thickened to 4 cm in diameter, with a large appendicolith. The mesoappendix is inflamed. On coronal reformats the thickened appendiceal base shows a low-density appearance, suspect contained perforation with up to 2 cm abscess. No pneumoperitoneum. No bowel obstruction  Vascular/Lymphatic: No acute vascular abnormality. No mass or adenopathy.  Reproductive:No pathologic  findings.  Other: No ascites or pneumoperitoneum.  Musculoskeletal: No acute abnormalities.  These results were called by telephone at the time of interpretation on 05/11/2018 at 6:03 am to Dr. Sharman Cheek , who verbally acknowledged these results.  IMPRESSION: Acute perforated appendicitis with probable 2 cm collection. Appendicolith is present.   Electronically Signed   By: Marnee Spring M.D.   On: 05/11/2018 06:04  Assessment/Plan:  53 y.o. female with acute perforated appendicitis with suspected abscess.  Patient with history of abdominal pain of a week long that suggested that the appendicitis started at that time and now is perforated with a suspected abscess. The characterization of the abscess was not able to be completely evaluated since the CT scan was done without contrast.   Patient was oriented about the CT scan findings. Discussion was done with her and her husband about the suspected abscess. I explained the rationale of repeating the CT scan with contrast. If there is a drainable abscess, will try to treat the appendicitis with percutaneous drainage and antibiotics. Patient oriented about the rationale of this treatment with the goal of avoid the need of small bowel and/or large intestine resection due to the amount of inflammation and non visualization of structures. Patient also oriented that if this treatment fails and needs surgical management during this admission, the risk of surgery is higher including need of bowel resection, injury to structures, leak from resection or anastomosis, need of open surgical procedure, among others. If patient respond and heals with drain and antibiotic therapy, interval appendectomy can be perform with less risks.   All this discussion was taken with the patient and her husband and all question were answered.    Gae Gallop, MD

## 2018-05-11 NOTE — ED Provider Notes (Addendum)
[facility] Emergency Department Provider Note  ____________________________________________  Time seen: Approximately 5:47 AM  I have reviewed the triage vital signs and the nursing notes.   HISTORY  Chief Complaint Abdominal Pain    HPI Pamela Taylor is a 53 y.o. female with a history of anemia and depression who complains of right lower quadrant pain for the past week and vomiting that started yesterday.  Pain is constant, waxing and waning, radiating from right lower quadrant up to her right upper quadrant.  Denies fever chills diarrhea or constipation.  No body aches.  Decreased appetite over the past several days.  Patient is unclear if eating worsens her pain.      Past Medical History:  Diagnosis Date  . Anemia 05/17/2009  . Depression   . Hyperlipidemia      Patient Active Problem List   Diagnosis Date Noted  . Difficulty hearing 09/05/2015  . Pain in soft tissues of limb 09/05/2015  . 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 Acute anxiety 12/21/2014  . Anemia, iron deficiency 05/17/2009  . Hypercholesterolemia without hypertriglyceridemia 05/13/2009  . Clinical depression 04/27/2009  . Dermatologic disease 04/27/2009  . Herpes 04/27/2009     Past Surgical History:  Procedure Laterality Date  . BTL  1993  . CESAREAN SECTION  [phone]  . REFRACTIVE SURGERY Bilateral 2003     Prior to Admission medications   Medication Sig Start Date End Date Taking? Authorizing Provider  albuterol (PROVENTIL HFA;VENTOLIN HFA) 108 (90 Base) MCG/ACT inhaler Inhale 2 puffs into the lungs every 6 (six) hours as needed for wheezing or shortness of breath. 05/28/17   Trey Sailors, PA-C  ALPRAZolam Prudy Feeler) 0.5 MG tablet Take 1 tablet (0.5 mg total) by mouth at bedtime as needed for anxiety. 11/18/17   Margaretann Loveless, PA-C  PARoxetine (PAXIL) 20 MG tablet Take 1 tablet (20 mg total) by mouth daily.  02/12/18   Malva Limes, MD  valACYclovir (VALTREX) 1000 MG tablet TAKE 1 TABLET (1,000 MG TOTAL) BY MOUTH DAILY. AS NEEDED FOR COLD SORES. 08/21/17   Margaretann Loveless, PA-C  Zoster Vaccine Adjuvanted [facility]) injection TO BE ADMINISTERED BY PHARMACIST FOR IMMUNIZATION 12/11/16   [provider]     Allergies Patient has no known allergies.   Family History  Problem Relation Age of Onset  . Depression Father   . Cancer Mother 50       Breast cancer  . Breast cancer Mother 42    Social History Social History   Tobacco Use  . Smoking status: Never Smoker  . Smokeless tobacco: Never Used  Substance Use Topics  . Alcohol use: Yes    Comment: Occasional   . Drug use: No    Review of Systems  Constitutional:   No fever or chills.  ENT:   No sore throat. No rhinorrhea. Cardiovascular:   No chest pain or syncope. Respiratory:   No dyspnea or cough. Gastrointestinal: Positive as above for abdominal pain and vomiting.  No diarrhea Musculoskeletal:   Negative for focal pain or swelling All other systems reviewed and are negative except as documented above in ROS and HPI.  ____________________________________________   PHYSICAL EXAM:  VITAL SIGNS: ED Triage Vitals [05/11/18 0446]  Enc Vitals Group     BP 123/65     Pulse Rate 88     Resp 18     Temp 98.1 F (36.7 C)     Temp Source  Oral     SpO2 100 %     Weight 130 lb (59 kg)     Height 5\' 2"  (1.575 m)     Head Circumference      Peak Flow      Pain Score      Pain Loc      Pain Edu?      Excl. in GC?     Vital signs reviewed, nursing assessments reviewed.   Constitutional:   Alert and oriented.  Ill-appearing. Eyes:   Conjunctivae are normal. EOMI. PERRL. ENT      Head:   Normocephalic and atraumatic.      Nose:   No congestion/rhinnorhea.       Mouth/Throat:   MMM, no pharyngeal erythema. No peritonsillar mass.       Neck:   No meningismus. Full  ROM. Hematological/Lymphatic/Immunilogical:   No cervical lymphadenopathy. Cardiovascular:   RRR. Symmetric bilateral radial and DP pulses.  No murmurs. Cap refill less than 2 seconds. Respiratory:   Normal respiratory effort without tachypnea/retractions. Breath sounds are clear and equal bilaterally. No wheezes/rales/rhonchi. Gastrointestinal:   Soft with diffuse tenderness, worse in the right lower quadrant. Non distended. There is no CVA tenderness.  There is rebound tenderness, no rigidity or guarding. Musculoskeletal:   Normal range of motion in all extremities. No joint effusions.  No lower extremity tenderness.  No edema. Neurologic:   Normal speech and language.  Motor grossly intact. No acute focal neurologic deficits are appreciated.  Skin:    Skin is warm, dry and intact. No rash noted.  No petechiae, purpura, or bullae.  ____________________________________________    LABS (pertinent positives/negatives) (all labs ordered are listed, but only abnormal results are displayed) Labs Reviewed  COMPREHENSIVE METABOLIC PANEL - Abnormal; Notable for the following components:      Result Value   Glucose, Bld 168 (*)    Calcium 8.8 (*)    AST 109 (*)    ALT 90 (*)    Alkaline Phosphatase 139 (*)    All other components within normal limits  CBC WITH DIFFERENTIAL/PLATELET - Abnormal; Notable for the following components:   WBC 10.7 (*)    Neutro Abs 8.1 (*)    All other components within normal limits  LIPASE, BLOOD  URINALYSIS, COMPLETE (UACMP) WITH MICROSCOPIC  POC URINE PREG, ED   ____________________________________________   EKG    ____________________________________________    RADIOLOGY  Ct Abdomen Pelvis Wo Contrast  Result Date: 05/11/2018 CLINICAL DATA:  Right lower quadrant pain. EXAM: CT ABDOMEN AND PELVIS WITHOUT CONTRAST TECHNIQUE: Multidetector CT imaging of the abdomen and pelvis was performed following the standard protocol without IV contrast.  COMPARISON:  None. FINDINGS: Lower chest:  No contributory findings. Hepatobiliary: No focal liver abnormality.No evidence of biliary obstruction or stone. Pancreas: Unremarkable. Spleen: Unremarkable. Possible 5 mm splenic artery aneurysm, non worrisome at this small size. Adrenals/Urinary Tract: Negative adrenals. No hydronephrosis or stone. Unremarkable bladder. Stomach/Bowel: The appendix is markedly thickened to 4 cm in diameter, with a large appendicolith. The mesoappendix is inflamed. On coronal reformats the thickened appendiceal base shows a low-density appearance, suspect contained perforation with up to 2 cm abscess. No pneumoperitoneum. No bowel obstruction Vascular/Lymphatic: No acute vascular abnormality. No mass or adenopathy. Reproductive:No pathologic findings. Other: No ascites or pneumoperitoneum. Musculoskeletal: No acute abnormalities. These results were called by telephone at the time of interpretation on 05/11/2018 at 6:03 am to Dr. Sharman CheekPHILLIP Sua Spadafora , who verbally acknowledged these results. IMPRESSION: Acute  perforated appendicitis with probable 2 cm collection. Appendicolith is present. Electronically Signed   By: Marnee SpringJonathon  Watts M.D.   On: 05/11/2018 06:04    ____________________________________________   PROCEDURES Procedures  ____________________________________________  DIFFERENTIAL DIAGNOSIS   Appendicitis, cholecystitis, pancreatitis, bowel perforation, bowel obstruction, diverticulitis, cystitis, pyelonephritis  CLINICAL IMPRESSION / ASSESSMENT AND PLAN / ED COURSE  Pertinent labs & imaging results that were available during my care of the patient were reviewed by me and considered in my medical decision making (see chart for details).      Clinical Course as of May 11 698  Mon May 11, 2018  [id number] Presents with severe abdominal pain in the right lower quadrant and vomiting.  Exam shows diffuse abdominal tenderness but most severe in the right lower quadrant.   Concern for appendicitis.  Doubt torsion, ovarian cyst, biliary disease pancreatitis bowel obstruction or perforation.  Possibly a hernia.  I will obtain labs and a CT scan.  Morphine 4 mg IV, Zofran 4 mg IV, 1 L IV saline for symptom control.   [PS]  0546 LFTs mildly elevated.  T bili is normal.  Due to nonlocalized exam will proceed with CT scan, but if negative may need ultrasound right upper quadrant.   [PS]  [id number] Received call from radiology notifying of finding of perforated appendicitis.  Discussed with surgery.  Zosyn ordered.   [PS]  [id number] Patient and family updated.  Patient still with 6/10 pain, much worse if she does any movement.  Nausea controlled.  I will give additional morphine for now.  Repeat exam does show persistent generalized tenderness with rebound.  Abdomen is not rigid.   [PS]  [id number] Febrile.  Will give IV Toradol for fever control.   [PS]    Clinical Course User Index [PS] Sharman CheekStafford, Lindsi Bayliss, MD     ____________________________________________   FINAL CLINICAL IMPRESSION(S) / ED DIAGNOSES    Final diagnoses:  Right lower quadrant abdominal pain  Non-intractable vomiting with nausea, unspecified vomiting type  Acute perforated appendicitis     ED Discharge Orders    None      Portions of this note were generated with dragon dictation software. Dictation errors may occur despite best attempts at proofreading.   Sharman CheekStafford, Ileigh Mettler, MD 05/11/18 [id number]    Sharman CheekStafford, Aislynn Cifelli, MD 05/11/18 0700

## 2018-05-11 NOTE — ED Notes (Signed)
ED TO INPATIENT HANDOFF REPORT  ED Nurse Name and Phone #: Loleta Dicker, RN [phone]  Name/Age/Gender Pamela Taylor 53 y.o. female Room/Bed: ED08A/ED08A  Code Status   Code Status: Full Code  Home/SNF/Other Home Patient oriented x 4 Is this baseline? Yes   Triage Complete: Triage complete  Chief Complaint abd pain  Triage Note Pt arrives with RLQ pain and vomiting. Pt a three person assist out of vehicle. Skin pale. Pt actively vomiting and alternating with "help me I don't feel good" during triage. Pt's spouse states pain has been present for a week and is getting worse.    Allergies No Known Allergies  Level of Care/Admitting Diagnosis ED Disposition    ED Disposition Condition Comment   Admit  Hospital Area: [facility] [[id number]]  Level of Care: Med-Surg [16]  Diagnosis: Acute appendicitis with peritoneal abscess [540.1.ICD-9-CM]  Admitting Physician: Carolan Shiver [[id number]]  Attending Physician: Carolan Shiver [[id number]]  Estimated length of stay: 3 - 4 days  Certification:: I certify this patient will need inpatient services for at least 2 midnights  PT Class (Do Not Modify): Inpatient [101]  PT Acc Code (Do Not Modify): Private [1]       Medical/Surgery History Past Medical History:  Diagnosis Date  . Anemia 05/17/2009  . Depression   . Hyperlipidemia    Past Surgical History:  Procedure Laterality Date  . BTL  1993  . CESAREAN SECTION  [phone]  . REFRACTIVE SURGERY Bilateral 2003     IV Location/Drains/Wounds Patient Lines/Drains/Airways Status   Active Line/Drains/Airways    Name:   Placement date:   Placement time:   Site:   Days:   Peripheral IV 05/11/18 Right Antecubital   05/11/18    0455    Antecubital   less than 1   Peripheral IV (Ped) 05/11/18 Hand   05/11/18    0630     less than 1          Intake/Output Last 24 hours  Intake/Output Summary (Last 24 hours) at 05/11/2018 0929 Last data  filed at 05/11/2018 0800 Gross per 24 hour  Intake 1050 ml  Output -  Net 1050 ml    Labs/Imaging Results for orders placed or performed during the hospital encounter of 05/11/18 (from the past 48 hour(s))  Comprehensive metabolic panel     Status: Abnormal   Collection Time: 05/11/18  4:57 AM  Result Value Ref Range   Sodium 138 135 - 145 mmol/L   Potassium 3.8 3.5 - 5.1 mmol/L   Chloride 104 98 - 111 mmol/L   CO2 25 22 - 32 mmol/L   Glucose, Bld 168 (H) 70 - 99 mg/dL   BUN 8 6 - 20 mg/dL   Creatinine, Ser 2.35 0.44 - 1.00 mg/dL   Calcium 8.8 (L) 8.9 - 10.3 mg/dL   Total Protein 7.5 6.5 - 8.1 g/dL   Albumin 3.8 3.5 - 5.0 g/dL   AST 573 (H) 15 - 41 U/L   ALT 90 (H) 0 - 44 U/L   Alkaline Phosphatase 139 (H) 38 - 126 U/L   Total Bilirubin 0.8 0.3 - 1.2 mg/dL   GFR calc non Af Amer >60 >60 mL/min   GFR calc Af Amer >60 >60 mL/min   Anion gap 9 5 - 15    Comment: Performed at [facility], [address] [place], [place] [address]  Lipase, blood     Status: None   Collection Time: 05/11/18  4:57 AM  Result Value Ref Range   Lipase 23 11 - 51 U/L    Comment: Performed at [facility], [address] [place], [place] [address]  CBC with Differential     Status: Abnormal   Collection Time: 05/11/18  4:57 AM  Result Value Ref Range   WBC 10.7 (H) 4.0 - 10.5 K/uL   RBC 4.08 3.87 - 5.11 MIL/uL   Hemoglobin 12.6 12.0 - 15.0 g/dL   HCT 19.1 47.8 - 29.5 %   MCV 92.2 80.0 - 100.0 fL   MCH 30.9 26.0 - 34.0 pg   MCHC 33.5 30.0 - 36.0 g/dL   RDW 62.1 30.8 - 65.7 %   Platelets 295 150 - 400 K/uL   nRBC 0.0 0.0 - 0.2 %   Neutrophils Relative % 76 %   Neutro Abs 8.1 (H) 1.7 - 7.7 K/uL   Lymphocytes Relative 14 %   Lymphs Abs 1.5 0.7 - 4.0 K/uL   Monocytes Relative 9 %   Monocytes Absolute 0.9 0.1 - 1.0 K/uL   Eosinophils Relative 1 %   Eosinophils Absolute 0.1 0.0 - 0.5 K/uL   Basophils Relative 0 %   Basophils Absolute 0.0 0.0 - 0.1 K/uL   Immature  Granulocytes 0 %   Abs Immature Granulocytes 0.03 0.00 - 0.07 K/uL    Comment: Performed at [facility], [address] [place], [place] [address]  Urinalysis, Complete w Microscopic     Status: Abnormal   Collection Time: 05/11/18  6:30 AM  Result Value Ref Range   Color, Urine YELLOW (A) YELLOW   APPearance CLEAR (A) CLEAR   Specific Gravity, Urine 1.012 1.005 - 1.030   pH 5.0 5.0 - 8.0   Glucose, UA NEGATIVE NEGATIVE mg/dL   Hgb urine dipstick MODERATE (A) NEGATIVE   Bilirubin Urine NEGATIVE NEGATIVE   Ketones, ur 5 (A) NEGATIVE mg/dL   Protein, ur NEGATIVE NEGATIVE mg/dL   Nitrite NEGATIVE NEGATIVE   Leukocytes, UA NEGATIVE NEGATIVE   RBC / HPF 0-5 0 - 5 RBC/hpf   WBC, UA 0-5 0 - 5 WBC/hpf   Bacteria, UA NONE SEEN NONE SEEN   Squamous Epithelial / LPF 0-5 0 - 5   Mucus PRESENT     Comment: Performed at [facility], [address] [place], [place] [address]  Pregnancy, urine     Status: None   Collection Time: 05/11/18  7:32 AM  Result Value Ref Range   Preg Test, Ur NEGATIVE NEGATIVE    Comment: Performed at [facility], [address], [place], [place] [address]   Ct Abdomen Pelvis Wo Contrast  Result Date: 05/11/2018 CLINICAL DATA:  Right lower quadrant pain. EXAM: CT ABDOMEN AND PELVIS WITHOUT CONTRAST TECHNIQUE: Multidetector CT imaging of the abdomen and pelvis was performed following the standard protocol without IV contrast. COMPARISON:  None. FINDINGS: Lower chest:  No contributory findings. Hepatobiliary: No focal liver abnormality.No evidence of biliary obstruction or stone. Pancreas: Unremarkable. Spleen: Unremarkable. Possible 5 mm splenic artery aneurysm, non worrisome at this small size. Adrenals/Urinary Tract: Negative adrenals. No hydronephrosis or stone. Unremarkable bladder. Stomach/Bowel: The appendix is markedly thickened to 4 cm in diameter, with a large appendicolith. The mesoappendix is inflamed. On coronal reformats the  thickened appendiceal base shows a low-density appearance, suspect contained perforation with up to 2 cm abscess. No pneumoperitoneum. No bowel obstruction Vascular/Lymphatic: No acute vascular abnormality. No mass or adenopathy. Reproductive:No pathologic findings. Other: No ascites or pneumoperitoneum. Musculoskeletal: No acute  abnormalities. These results were called by telephone at the time of interpretation on 05/11/2018 at 6:03 am to Dr. Sharman CheekPHILLIP STAFFORD , who verbally acknowledged these results. IMPRESSION: Acute perforated appendicitis with probable 2 cm collection. Appendicolith is present. Electronically Signed   By: Marnee SpringJonathon  Watts M.D.   On: 05/11/2018 06:04   Ct Abdomen Pelvis W Contrast  Result Date: 05/11/2018 CLINICAL DATA:  Follow-up perforated appendicitis. EXAM: CT ABDOMEN AND PELVIS WITH CONTRAST TECHNIQUE: Multidetector CT imaging of the abdomen and pelvis was performed using the standard protocol following bolus administration of intravenous contrast. CONTRAST:  75mL OMNIPAQUE IOHEXOL 300 MG/ML  SOLN COMPARISON:  Earlier today FINDINGS: Lower chest:  No contributory findings. Hepatobiliary: No focal liver abnormality.No evidence of biliary obstruction or stone. Pancreas: Unremarkable. Spleen: Unremarkable. Adrenals/Urinary Tract: Negative adrenals. No hydronephrosis or stone. Unremarkable bladder. Stomach/Bowel: Known acute appendicitis with large appendicoliths. There is perforation at the appendiceal base with 30 x 12 x 16 mm abscess. Vascular/Lymphatic: No acute vascular abnormality. Reactive ileocolic lymph nodes. Reproductive:No pathologic findings. Other: No ascites or pneumoperitoneum. Musculoskeletal: No acute abnormalities. IMPRESSION: Known perforated appendicitis with 30 x 12 x 16 mm abscess. Appendicolith is present. Electronically Signed   By: Marnee SpringJonathon  Watts M.D.   On: 05/11/2018 07:29    Pending Labs Unresulted Labs (From admission, onward)    Start     Ordered    05/12/18 0500  Basic metabolic panel  Tomorrow morning,   STAT     05/11/18 0857   05/12/18 0500  CBC  Tomorrow morning,   STAT     05/11/18 0857   05/11/18 0919  Protime-INR  ONCE - STAT,   STAT     05/11/18 0918   05/11/18 0919  APTT  ONCE - STAT,   STAT     05/11/18 0918          Vitals/Pain Today's Vitals   05/11/18 0446 05/11/18 0636 05/11/18 0730 05/11/18 0800  BP: 123/65  110/61 109/62  Pulse: 88  95 89  Resp: 18     Temp: 98.1 F (36.7 C) (!) 100.7 F (38.2 C)    TempSrc: Oral Oral    SpO2: 100%  98% 92%  Weight: 59 kg     Height: 5\' 2"  (1.575 m)       Isolation Precautions No active isolations  Medications Medications  ALPRAZolam (XANAX) tablet 0.5 mg (has no administration in time range)  PARoxetine (PAXIL) tablet 20 mg (has no administration in time range)  0.9 %  sodium chloride infusion (has no administration in time range)  piperacillin-tazobactam (ZOSYN) IVPB 3.375 g (has no administration in time range)  acetaminophen (TYLENOL) tablet 650 mg (has no administration in time range)    Or  acetaminophen (TYLENOL) suppository 650 mg (has no administration in time range)  HYDROcodone-acetaminophen (NORCO/VICODIN) 5-325 MG per tablet 1-2 tablet (has no administration in time range)  morphine 4 MG/ML injection 4 mg (has no administration in time range)  ondansetron (ZOFRAN-ODT) disintegrating tablet 4 mg (has no administration in time range)    Or  ondansetron (ZOFRAN) injection 4 mg (has no administration in time range)  famotidine (PEPCID) IVPB 20 mg premix (has no administration in time range)  morphine 4 MG/ML injection 4 mg (4 mg Intravenous Given 05/11/18 0500)  ondansetron (ZOFRAN) injection 4 mg (4 mg Intravenous Given 05/11/18 0500)  sodium chloride 0.9 % bolus 1,000 mL (0 mLs Intravenous Stopped 05/11/18 0800)  iohexol (OMNIPAQUE) 300 MG/ML solution 75 mL (75 mLs Intravenous  Contrast Given 05/11/18 0543)  piperacillin-tazobactam (ZOSYN) IVPB 3.375 g (0  g Intravenous Stopped 05/11/18 0706)  morphine 4 MG/ML injection 4 mg (4 mg Intravenous Given 05/11/18 0635)  ketorolac (TORADOL) 30 MG/ML injection 9.9 mg (9.9 mg Intravenous Given 05/11/18 0652)  iohexol (OMNIPAQUE) 300 MG/ML solution 75 mL (75 mLs Intravenous Contrast Given 05/11/18 0711)    Mobility walks High fall risk   Focused Assessments ruptured appendix, pelvic pain, lethargic   Recommendations: See Admitting Provider Note  Report given to:   Additional Notes: Patient's husband will be returning from home shortly.  Patient will be going to CT for abscess draining this afternoon.

## 2018-05-11 NOTE — ED Notes (Signed)
Patient transported to CT at this time.  Will continue to monitor. 

## 2018-05-12 LAB — BASIC METABOLIC PANEL
Anion gap: 6 (ref 5–15)
BUN: 10 mg/dL (ref 6–20)
CO2: 26 mmol/L (ref 22–32)
Calcium: 8.1 mg/dL — ABNORMAL LOW (ref 8.9–10.3)
Chloride: 105 mmol/L (ref 98–111)
Creatinine, Ser: 0.66 mg/dL (ref 0.44–1.00)
GFR calc Af Amer: >60 mL/min (ref >60)
GFR calc non Af Amer: >60 mL/min (ref >60)
Glucose, Bld: 152 mg/dL — ABNORMAL HIGH (ref 70–99)
Potassium: 3.6 mmol/L (ref 3.5–5.1)
Sodium: 137 mmol/L (ref 135–145)

## 2018-05-12 LAB — CBC
HCT: 31.3 % — ABNORMAL LOW (ref 36.0–46.0)
HEMOGLOBIN: 10.4 g/dL — AB (ref 12.0–15.0)
MCH: 31.1 pg (ref 26.0–34.0)
MCHC: 33.2 g/dL (ref 30.0–36.0)
MCV: 93.7 fL (ref 80.0–100.0)
Platelets: 241 10*3/uL (ref 150–400)
RBC: 3.34 MIL/uL — ABNORMAL LOW (ref 3.87–5.11)
RDW: 12.5 % (ref 11.5–15.5)
WBC: 12.5 10*3/uL — ABNORMAL HIGH (ref 4.0–10.5)
nRBC: 0 % (ref 0.0–0.2)

## 2018-05-12 MED ORDER — ALPRAZOLAM 0.5 MG PO TABS
0.5000 mg | ORAL_TABLET | Freq: Three times a day (TID) | ORAL | Status: DC | PRN
  Administered 2018-05-12 – 2018-05-13 (×3): 0.5 mg via ORAL
  Filled 2018-05-12 (×3): qty 1

## 2018-05-12 MED ORDER — ENOXAPARIN SODIUM 40 MG/0.4ML ~~LOC~~ SOLN
40.0000 mg | SUBCUTANEOUS | Status: DC
  Administered 2018-05-12 – 2018-05-13 (×2): 40 mg via SUBCUTANEOUS
  Filled 2018-05-12 (×2): qty 0.4

## 2018-05-12 NOTE — Progress Notes (Signed)
SURGICAL PROGRESS NOTE   Hospital Day(s): 1.   Post op day(s):  Marland Kitchen   Interval History: Patient seen and examined, no acute events or new complaints overnight. Patient reports feeling much better today, denies nausea or vomiting.  Vital signs in last 24 hours: [min-max] current  Temp:  [98.7 F (37.1 C)-102.2 F (39 C)] 98.9 F (37.2 C) (01/28 0523) Pulse Rate:  [77-101] 85 (01/28 0523) Resp:  [13-23] 20 (01/27 2151) BP: (91-107)/(54-62) 94/55 (01/28 0523) SpO2:  [91 %-99 %] 91 % (01/28 0523)     Height: 5\' 2"  (157.5 cm)(information obtained via spouse) Weight: 59 kg BMI (Calculated): 23.77   Drain: 30 mL sero-purulent  Physical Exam:  Constitutional: alert, cooperative and no distress  Respiratory: breathing non-labored at rest  Cardiovascular: regular rate and sinus rhythm  Gastrointestinal: soft, mild-tender, and non-distended  Labs:  CBC Latest Ref Rng & Units 05/12/2018 05/11/2018 11/18/2017  WBC 4.0 - 10.5 K/uL 12.5(H) 10.7(H) 7.3  Hemoglobin 12.0 - 15.0 g/dL 10.4(L) 12.6 13.5  Hematocrit 36.0 - 46.0 % 31.3(L) 37.6 39.5  Platelets 150 - 400 K/uL 241 295 265   CMP Latest Ref Rng & Units 05/12/2018 05/11/2018 11/18/2017  Glucose 70 - 99 mg/dL 219(X) 588(T) 79  BUN 6 - 20 mg/dL 10 8 14   Creatinine 0.44 - 1.00 mg/dL 2.54 9.82 6.41  Sodium 135 - 145 mmol/L 137 138 140  Potassium 3.5 - 5.1 mmol/L 3.6 3.8 3.9  Chloride 98 - 111 mmol/L 105 104 104  CO2 22 - 32 mmol/L 26 25 23   Calcium 8.9 - 10.3 mg/dL 8.1(L) 8.8(L) 9.3  Total Protein 6.5 - 8.1 g/dL - 7.5 6.8  Total Bilirubin 0.3 - 1.2 mg/dL - 0.8 0.4  Alkaline Phos 38 - 126 U/L - 139(H) 70  AST 15 - 41 U/L - 109(H) 25  ALT 0 - 44 U/L - 90(H) 24    Imaging studies:  CT guided percutaneous drainage images personally reviewed seeing catheter in abscess area.    Assessment/Plan:  53 y.o. female with perforated appendicitis with abscess s/p percutaneous drainage.   Clinically doing better today with improved pain. Still had  multiple episodes of fever yesterday and there was a slight increase in WBC count. Still needs to continue with IV antibiotic therapy until she is more than 24 hours afebrile and with normalized WBC counts. Since she tolerated the clear liquid, the pain is improve and she does not has nausea, will advance diet to soft diet.   Encourage to ambulate.   Gae Gallop, MD

## 2018-05-13 MED ORDER — SODIUM CHLORIDE 0.9 % IV SOLN
INTRAVENOUS | Status: DC | PRN
  Administered 2018-05-13: 250 mL via INTRAVENOUS
  Administered 2018-05-13: 20 mL via INTRAVENOUS

## 2018-05-13 NOTE — Progress Notes (Signed)
SURGICAL PROGRESS NOTE   Hospital Day(s): 2.   Post op day(s):  Marland Kitchen   Interval History: Patient seen and examined, no acute events or new complaints overnight. Patient reports feeling a little bit better. Continue with significant pain on the right lower quadrant but more localized, denies nausea and vomiting.  Vital signs in last 24 hours: [min-max] current  Temp:  [97.3 F (36.3 C)-99.1 F (37.3 C)] 97.3 F (36.3 C) (01/29 0431) Pulse Rate:  [84-91] 87 (01/29 0815) Resp:  [16-26] 26 (01/29 0815) BP: (96-114)/(51-61) 114/56 (01/29 0815) SpO2:  [90 %-97 %] 97 % (01/29 0815)     Height: 5\' 2"  (157.5 cm)(information obtained via spouse) Weight: 59 kg BMI (Calculated): 23.77   Drain: 15 mL in last 24 hours  Physical Exam:  Constitutional: alert, cooperative and no distress  Respiratory: breathing non-labored at rest  Cardiovascular: regular rate and sinus rhythm  Gastrointestinal: soft, moderate-tender on right lower quadrant, and non-distended  Labs:  CBC Latest Ref Rng & Units 05/12/2018 05/11/2018 11/18/2017  WBC 4.0 - 10.5 K/uL 12.5(H) 10.7(H) 7.3  Hemoglobin 12.0 - 15.0 g/dL 10.4(L) 12.6 13.5  Hematocrit 36.0 - 46.0 % 31.3(L) 37.6 39.5  Platelets 150 - 400 K/uL 241 295 265   CMP Latest Ref Rng & Units 05/12/2018 05/11/2018 11/18/2017  Glucose 70 - 99 mg/dL 151(V) 616(W) 79  BUN 6 - 20 mg/dL 10 8 14   Creatinine 0.44 - 1.00 mg/dL 7.37 1.06 2.69  Sodium 135 - 145 mmol/L 137 138 140  Potassium 3.5 - 5.1 mmol/L 3.6 3.8 3.9  Chloride 98 - 111 mmol/L 105 104 104  CO2 22 - 32 mmol/L 26 25 23   Calcium 8.9 - 10.3 mg/dL 8.1(L) 8.8(L) 9.3  Total Protein 6.5 - 8.1 g/dL - 7.5 6.8  Total Bilirubin 0.3 - 1.2 mg/dL - 0.8 0.4  Alkaline Phos 38 - 126 U/L - 139(H) 70  AST 15 - 41 U/L - 109(H) 25  ALT 0 - 44 U/L - 90(H) 24    Imaging studies: No new pertinent imaging studies   Assessment/Plan:  53 y.o. female with perforated appendicitis with abscess s/p percutaneous drainage.    Improving and recovering slowly. Did not have fever in last 24 hours but still with significant pain on the right lower quadrant. Still needs to continue with IV antibiotic therapy until normalized WBC counts. Will repeat labs in the morning. Since she tolerated the full liquid, the pain is improve and she does not has nausea, will advance diet to soft diet. Encourage to ambulate.   Gae Gallop, MD

## 2018-05-13 NOTE — Progress Notes (Signed)
Drain care teaching performed with patient; good teach back demonstration noted.  Patient flushed and emptied drain independently and verbalized understanding.

## 2018-05-14 ENCOUNTER — Other Ambulatory Visit: Payer: Self-pay | Admitting: General Surgery

## 2018-05-14 DIAGNOSIS — K3533 Acute appendicitis with perforation and localized peritonitis, with abscess: Secondary | ICD-10-CM

## 2018-05-14 LAB — CBC WITH DIFFERENTIAL/PLATELET
Abs Immature Granulocytes: 0.04 10*3/uL (ref 0.00–0.07)
Basophils Absolute: 0 10*3/uL (ref 0.0–0.1)
Basophils Relative: 0 %
Eosinophils Absolute: 0.2 10*3/uL (ref 0.0–0.5)
Eosinophils Relative: 3 %
HCT: 28.5 % — ABNORMAL LOW (ref 36.0–46.0)
Hemoglobin: 9.7 g/dL — ABNORMAL LOW (ref 12.0–15.0)
Immature Granulocytes: 1 %
Lymphocytes Relative: 18 %
Lymphs Abs: 1.5 10*3/uL (ref 0.7–4.0)
MCH: 31.3 pg (ref 26.0–34.0)
MCHC: 34 g/dL (ref 30.0–36.0)
MCV: 91.9 fL (ref 80.0–100.0)
Monocytes Absolute: 0.9 10*3/uL (ref 0.1–1.0)
Monocytes Relative: 11 %
Neutro Abs: 5.6 10*3/uL (ref 1.7–7.7)
Neutrophils Relative %: 67 %
Platelets: 312 10*3/uL (ref 150–400)
RBC: 3.1 MIL/uL — ABNORMAL LOW (ref 3.87–5.11)
RDW: 12.6 % (ref 11.5–15.5)
WBC: 8.2 10*3/uL (ref 4.0–10.5)
nRBC: 0 % (ref 0.0–0.2)

## 2018-05-14 MED ORDER — HYDROCODONE-ACETAMINOPHEN 5-325 MG PO TABS: 1.0000 | ORAL_TABLET | ORAL | 0 refills | Status: DC | PRN

## 2018-05-14 MED ORDER — AMOXICILLIN-POT CLAVULANATE 875-125 MG PO TABS: 1.0000 | ORAL_TABLET | Freq: Two times a day (BID) | ORAL | 0 refills | Status: AC

## 2018-05-14 MED ORDER — SODIUM CHLORIDE 0.9% FLUSH: 5.0000 mL | Freq: Every day | INTRAVENOUS | 0 refills | Status: AC

## 2018-05-14 NOTE — Discharge Summary (Signed)
  Patient ID: Shauniece Taylor MRN: [id number] DOB/AGE: [birth year] 53 y.o.  Admit date: 05/11/2018 Discharge date: 05/14/2018   Discharge Diagnoses:  Active Problems:   Acute appendicitis with peritoneal abscess   Procedures: Percutaneous CT drainage of peritoneal abscess  Hospital Course: Patient with perforated appendicitis with abscess. It was drained percutaneously by IR. She was treated with IV antibiotic therapy. Today pain is more controlled, tolerating diet, having bowel movement, WBC count returned to normal and drain fluid is serous.   Physical Exam  Constitutional: She is well-developed, well-nourished, and in no distress.  Cardiovascular: Normal rate, regular rhythm and normal heart sounds.  Pulmonary/Chest: Effort normal and breath sounds normal. No respiratory distress.  Abdominal: Soft. Bowel sounds are normal. She exhibits no distension.  Mild tender to palpation on right lower quadrant.    Consults: Interventional Radiology  Disposition: Discharge disposition: 01-Home or Self Care       Discharge Instructions    Diet - low sodium heart healthy   Complete by:  As directed    Discharge wound care:   Complete by:  As directed    Flush drain at least once a day and dressing changes as instructed in the hospital.   Increase activity slowly   Complete by:  As directed      Allergies as of 05/14/2018   No Known Allergies     Medication List    TAKE these medications   albuterol 108 (90 Base) MCG/ACT inhaler Commonly known as:  PROVENTIL HFA;VENTOLIN HFA Inhale 2 puffs into the lungs every 6 (six) hours as needed for wheezing or shortness of breath.   ALPRAZolam 0.5 MG tablet Commonly known as:  XANAX Take 1 tablet (0.5 mg total) by mouth at bedtime as needed for anxiety.   amoxicillin-clavulanate 875-125 MG tablet Commonly known as:  AUGMENTIN Take 1 tablet by mouth 2 (two) times daily for 10 days.   HYDROcodone-acetaminophen 5-325 MG  tablet Commonly known as:  NORCO/VICODIN Take 1 tablet by mouth every 4 (four) hours as needed for moderate pain.   PARoxetine 20 MG tablet Commonly known as:  PAXIL Take 1 tablet (20 mg total) by mouth daily.   sodium chloride flush 0.9 % Soln Commonly known as:  NS 5 mLs by Intracatheter route daily for 14 days.   valACYclovir 1000 MG tablet Commonly known as:  VALTREX TAKE 1 TABLET (1,000 MG TOTAL) BY MOUTH DAILY. AS NEEDED FOR COLD SORES.            Discharge Care Instructions  (From admission, onward)         Start     Ordered   05/14/18 0000  Discharge wound care:    Comments:  Flush drain at least once a day and dressing changes as instructed in the hospital.   05/14/18 1018         Follow-up Information    Carolan Shiver, MD Follow up in 1 week(s).   Specialty:  General Surgery Contact information: [address] [place] [address] [phone]          This was a > 30 minutes discharge encounter, most of the time counseling and coordinating plan of care.

## 2018-05-14 NOTE — Progress Notes (Signed)
Discharge instructions reviewed with patient including followup visits, drain flushing and care, and new medications.  Understanding was verbalized and all questions were answered.  IV removed without complication; patient tolerated well.  Patient discharged home via wheelchair in stable condition escorted by volunteer staff.

## 2018-05-14 NOTE — Discharge Instructions (Signed)
°  Diet: Resume home heart healthy regular diet.   Activity: Increase activity level as tolerated, light activity and walking are encouraged. Do not drive or drink alcohol if taking narcotic pain medications.  Wound care: May shower with soapy water and pat dry (do not rub incisions), but no baths or submerging incision underwater until follow-up. (no swimming)  Flush drain at least once a day.   Medications: Resume all home medications. For mild to moderate pain: acetaminophen (Tylenol) or ibuprofen (if no kidney disease). Combining Tylenol with alcohol can substantially increase your risk of causing liver disease. Narcotic pain medications, if prescribed, can be used for severe pain, though may cause nausea, constipation, and drowsiness. Do not combine Tylenol and Norco within a 6 hour period as Norco contains Tylenol. If you do not need the narcotic pain medication, you do not need to fill the prescription.  Ibuprofen 800 mg orally every 6 hours as needed for pain.   Call office [phone]) at any time if any questions, worsening pain, fevers/chills, bleeding, drainage from incision site, or other concerns.

## 2018-05-16 LAB — AEROBIC/ANAEROBIC CULTURE W GRAM STAIN (SURGICAL/DEEP WOUND): Special Requests: NORMAL

## 2018-05-16 LAB — AEROBIC/ANAEROBIC CULTURE (SURGICAL/DEEP WOUND)

## 2018-05-22 ENCOUNTER — Telehealth: Payer: Self-pay | Admitting: Licensed Clinical Social Worker

## 2018-05-22 NOTE — Telephone Encounter (Signed)
CSW contacted patient in response to an EMMI call. Patient informed CSW that she is feeling good and is not having any concerns. She stated that she followed up with her surgeon yesterday and received encouraging news and has an appointment to meet with her Human Resources Department and feels that everything is going well. York Spaniel MSW,LcSW [phone]

## 2018-05-26 ENCOUNTER — Encounter: Payer: Self-pay | Admitting: Radiology

## 2018-05-26 ENCOUNTER — Ambulatory Visit
Admission: RE | Admit: 2018-05-26 | Discharge: 2018-05-26 | Disposition: A | Discharge status: Home / Self Care | Payer: BC Managed Care – PPO | Source: Ambulatory Visit | Attending: General Surgery | Admitting: General Surgery

## 2018-05-26 ENCOUNTER — Other Ambulatory Visit: Payer: Self-pay | Admitting: General Surgery

## 2018-05-26 DIAGNOSIS — K3533 Acute appendicitis with perforation and localized peritonitis, with abscess: Secondary | ICD-10-CM

## 2018-05-26 HISTORY — PX: IR RADIOLOGIST EVAL & MGMT: IMG5224

## 2018-05-26 MED ORDER — IOPAMIDOL (ISOVUE-300) INJECTION 61%
100.0000 mL | Freq: Once | INTRAVENOUS | Status: AC | PRN
  Administered 2018-05-26: 100 mL via INTRAVENOUS

## 2018-05-26 NOTE — Progress Notes (Signed)
Referring Physician(s): Cintron-Diaz,Edgardo  Chief Complaint: The patient is seen in follow up today s/p acute appendicitis with peritoneal abscess requiring drain placement 05/11/18  History of present illness: Pamela Taylor is a 53 year female with past medical history of depression, HLD, and anemia who presented to [facility] 05/11/18 with abdominal pain. She was found to have acute appendicitis with a large appendicolith and suspected abscess.  She underwent drain placement the same day with Dr. Fredia Sorrow.  She was discharged home with drain in place and presents to IR clinic today for repeat imaging and follow-up.  Patient states she has been improving at home with only occasional abdominal pain.  She is able to eat and drink with tolerance.  She has completed a 10 day course of antibiotics.  Denies fever, chills, but does report night sweats nightly. She is having regular bowel movements.  She is flushing her drain with 5 mL sterile saline daily.  Reports output is minimal, mostly serous.   She is accompanied by her husband today. She has follow-up with her surgeon next month with plans for surgery after her drain is removed.   Past Medical History:  Diagnosis Date  . Anemia 05/17/2009  . Depression   . Hyperlipidemia     Past Surgical History:  Procedure Laterality Date  . BTL  1993  . CESAREAN SECTION  [phone]  . REFRACTIVE SURGERY Bilateral 2003    Allergies: Patient has no known allergies.  Medications: Prior to Admission medications   Medication Sig Start Date End Date Taking? Authorizing Provider  albuterol (PROVENTIL HFA;VENTOLIN HFA) 108 (90 Base) MCG/ACT inhaler Inhale 2 puffs into the lungs every 6 (six) hours as needed for wheezing or shortness of breath. Patient not taking: Reported on 05/11/2018 05/28/17   Trey Sailors, PA-C  ALPRAZolam Prudy Feeler) 0.5 MG tablet Take 1 tablet (0.5 mg total) by mouth at bedtime as needed for anxiety. 11/18/17   Margaretann Loveless, PA-C  HYDROcodone-acetaminophen (NORCO/VICODIN) 5-325 MG tablet Take 1 tablet by mouth every 4 (four) hours as needed for moderate pain. 05/14/18   Carolan Shiver, MD  PARoxetine (PAXIL) 20 MG tablet Take 1 tablet (20 mg total) by mouth daily. 02/12/18   Malva Limes, MD  sodium chloride flush (NS) 0.9 % SOLN 5 mLs by Intracatheter route daily for 14 days. 05/14/18 05/28/18  Carolan Shiver, MD  valACYclovir (VALTREX) 1000 MG tablet TAKE 1 TABLET (1,000 MG TOTAL) BY MOUTH DAILY. AS NEEDED FOR COLD SORES. 08/21/17   Margaretann Loveless, PA-C     Family History  Problem Relation Age of Onset  . Depression Father   . Cancer Mother 39       Breast cancer  . Breast cancer Mother 65    Social History   Socioeconomic History  . Marital status: Married    Spouse name: Not on file  . Number of children: Not on file  . Years of education: Not on file  . Highest education level: Not on file  Occupational History  . Not on file  Social Needs  . Financial resource strain: Not on file  . Food insecurity:    Worry: Not on file    Inability: Not on file  . Transportation needs:    Medical: Not on file    Non-medical: Not on file  Tobacco Use  . Smoking status: Never Smoker  . Smokeless tobacco: Never Used  Substance and Sexual Activity  . Alcohol use: Yes    Comment:  Occasional   . Drug use: No  . Sexual activity: Not on file  Lifestyle  . Physical activity:    Days per week: Not on file    Minutes per session: Not on file  . Stress: Not on file  Relationships  . Social connections:    Talks on phone: Not on file    Gets together: Not on file    Attends religious service: Not on file    Active member of club or organization: Not on file    Attends meetings of clubs or organizations: Not on file    Relationship status: Not on file  Other Topics Concern  . Not on file  Social History Narrative  . Not on file     Vital Signs: There were no vitals  taken for this visit.  Physical Exam  NAD, comfortable, not ill-appearing Abdomen:  Soft, non-tender.  Drain in place.  Insertion site c/d/i. No erythema or warmth. Small amount of sero-sanguinous output in bulb.  Imaging: No results found.  Labs:  CBC: Recent Labs    11/18/17 1546 05/11/18 0457 05/12/18 0259 05/14/18 0439  WBC 7.3 10.7* 12.5* 8.2  HGB 13.5 12.6 10.4* 9.7*  HCT 39.5 37.6 31.3* 28.5*  PLT 265 295 241 312    COAGS: Recent Labs    05/11/18 1023  INR 1.05  APTT 26    BMP: Recent Labs    11/18/17 1546 05/11/18 0457 05/12/18 0259  NA 140 138 137  K 3.9 3.8 3.6  CL 104 104 105  CO2 23 25 26   GLUCOSE 79 168* 152*  BUN 14 8 10   CALCIUM 9.3 8.8* 8.1*  CREATININE 0.78 0.64 0.66  GFRNONAA 86 >60 >60  GFRAA 100 >60 >60    LIVER FUNCTION TESTS: Recent Labs    11/18/17 1546 05/11/18 0457  BILITOT 0.4 0.8  AST 25 109*  ALT 24 90*  ALKPHOS 70 139*  PROT 6.8 7.5  ALBUMIN 4.2 3.8    Assessment: Acute appendicitis with peritoneal abscess requiring drain placement 05/11/18 Patient presents today for follow-up of her drain.  She presents in improved condition. She reports small amount of output from her drain daily.  Her imaging and drain injection are reviewed by Dr. Loreta Ave.  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  Drain to remain in place today.  Dr. Loreta Ave has discussed the results and care plan with the patient and her husband extensively.  They are aware of the possibility that the drain may need to remain for several weeks even up until surgery if fistula does not resolve. Patient plans to follow-up with her surgeon to discuss findings of today's visit.  A repeat drain injection study has been scheduled for 3 weeks and may be canceled if operative plans change.  Patient is instructed to stop the flushes and leave drain to bulb suction at this time.  She is  instructed to call if symptoms change/worsen.  She verbalizes understanding.     Signed: Hoyt Koch, PA 05/26/2018, 9:20 AM   Please refer to Dr. Loreta Ave attestation of this note for management and plan.

## 2018-06-09 ENCOUNTER — Other Ambulatory Visit: Payer: BC Managed Care – PPO

## 2018-06-18 ENCOUNTER — Ambulatory Visit
Admission: RE | Admit: 2018-06-18 | Discharge: 2018-06-18 | Disposition: A | Discharge status: Home / Self Care | Payer: BC Managed Care – PPO | Source: Ambulatory Visit | Attending: General Surgery | Admitting: General Surgery

## 2018-06-18 ENCOUNTER — Encounter: Payer: Self-pay | Admitting: Radiology

## 2018-06-18 DIAGNOSIS — K3533 Acute appendicitis with perforation and localized peritonitis, with abscess: Secondary | ICD-10-CM

## 2018-06-18 HISTORY — PX: IR RADIOLOGIST EVAL & MGMT: IMG5224

## 2018-06-18 NOTE — Progress Notes (Signed)
Referring Physician(s): Cintron-Diaz,Edgardo  Chief Complaint: The patient is seen in follow up today s/p ruptured appendix abscess drain placement 05/11/18.  History of present illness:  Follow up performed 05/26/18 revealed +fistula and fecalith per Dr Lenice Llamas left in place Stopped flushing Continued drain to JP  Very little OP for days now-- scant for last 3 days OP is light yellow Denies fever/chills Denies pain Finished antibiotics several days ago To see Surgeon March 10 --- has a trip to [place] planned in approx 1 week--- to be gone 10 days. Surgery planned for March 23 with Dr Tommi Rumps  Scheduled for drain injection today   Past Medical History:  Diagnosis Date  . Anemia 05/17/2009  . Depression   . Hyperlipidemia     Past Surgical History:  Procedure Laterality Date  . BTL  1993  . CESAREAN SECTION  [phone]  . IR RADIOLOGIST EVAL & MGMT  05/26/2018  . REFRACTIVE SURGERY Bilateral 2003    Allergies: Patient has no known allergies.  Medications: Prior to Admission medications   Medication Sig Start Date End Date Taking? Authorizing Provider  albuterol (PROVENTIL HFA;VENTOLIN HFA) 108 (90 Base) MCG/ACT inhaler Inhale 2 puffs into the lungs every 6 (six) hours as needed for wheezing or shortness of breath. Patient not taking: Reported on 05/11/2018 05/28/17   Trey Sailors, PA-C  ALPRAZolam Prudy Feeler) 0.5 MG tablet Take 1 tablet (0.5 mg total) by mouth at bedtime as needed for anxiety. 11/18/17   Margaretann Loveless, PA-C  HYDROcodone-acetaminophen (NORCO/VICODIN) 5-325 MG tablet Take 1 tablet by mouth every 4 (four) hours as needed for moderate pain. 05/14/18   Carolan Shiver, MD  PARoxetine (PAXIL) 20 MG tablet Take 1 tablet (20 mg total) by mouth daily. 02/12/18   Malva Limes, MD  valACYclovir (VALTREX) 1000 MG tablet TAKE 1 TABLET (1,000 MG TOTAL) BY MOUTH DAILY. AS NEEDED FOR COLD SORES. Patient taking differently: Take 1,000  mg by mouth daily as needed (cold sores).  08/21/17   Margaretann Loveless, PA-C     Family History  Problem Relation Age of Onset  . Depression Father   . Cancer Mother 53       Breast cancer  . Breast cancer Mother 32    Social History   Socioeconomic History  . Marital status: Married    Spouse name: Not on file  . Number of children: Not on file  . Years of education: Not on file  . Highest education level: Not on file  Occupational History  . Not on file  Social Needs  . Financial resource strain: Not on file  . Food insecurity:    Worry: Not on file    Inability: Not on file  . Transportation needs:    Medical: Not on file    Non-medical: Not on file  Tobacco Use  . Smoking status: Never Smoker  . Smokeless tobacco: Never Used  Substance and Sexual Activity  . Alcohol use: Yes    Comment: Occasional   . Drug use: No  . Sexual activity: Not on file  Lifestyle  . Physical activity:    Days per week: Not on file    Minutes per session: Not on file  . Stress: Not on file  Relationships  . Social connections:    Talks on phone: Not on file    Gets together: Not on file    Attends religious service: Not on file    Active member of club or  organization: Not on file    Attends meetings of clubs or organizations: Not on file    Relationship status: Not on file  Other Topics Concern  . Not on file  Social History Narrative  . Not on file     Vital Signs: There were no vitals taken for this visit.  Physical Exam Skin:    General: Skin is warm and dry.     Comments: Injection shows + fistula remains  Skin site is clean and dry NT no bleeding  no sign of infection     Imaging: No results found.  Labs:  CBC: Recent Labs    11/18/17 1546 05/11/18 0457 05/12/18 0259 05/14/18 0439  WBC 7.3 10.7* 12.5* 8.2  HGB 13.5 12.6 10.4* 9.7*  HCT 39.5 37.6 31.3* 28.5*  PLT 265 295 241 312    COAGS: Recent Labs    05/11/18 1023  INR 1.05  APTT 26      BMP: Recent Labs    11/18/17 1546 05/11/18 0457 05/12/18 0259  NA 140 138 137  K 3.9 3.8 3.6  CL 104 104 105  CO2 23 25 26   GLUCOSE 79 168* 152*  BUN 14 8 10   CALCIUM 9.3 8.8* 8.1*  CREATININE 0.78 0.64 0.66  GFRNONAA 86 >60 >60  GFRAA 100 >60 >60    LIVER FUNCTION TESTS: Recent Labs    11/18/17 1546 05/11/18 0457  BILITOT 0.4 0.8  AST 25 109*  ALT 24 90*  ALKPHOS 70 139*  PROT 6.8 7.5  ALBUMIN 4.2 3.8    Assessment:  Ruptured appendix- with peritoneal abscess Drain placed 05/11/18 + fistula noted on injection today Plan: Drain to remain for now keep area clean and dry Keep appt Tues with Dr Hazle Quant  Signed: Robet Leu, PA-C 06/18/2018, 1:52 PM   Please refer to Dr. Fredia Sorrow attestation of this note for management and plan.

## 2018-06-19 ENCOUNTER — Other Ambulatory Visit: Payer: Self-pay | Admitting: Physician Assistant

## 2018-06-19 DIAGNOSIS — F419 Anxiety disorder, unspecified: Secondary | ICD-10-CM

## 2018-06-22 ENCOUNTER — Encounter
Admission: RE | Admit: 2018-06-22 | Discharge: 2018-06-22 | Disposition: A | Discharge status: Home / Self Care | Payer: BC Managed Care – PPO | Source: Ambulatory Visit | Attending: General Surgery | Admitting: General Surgery

## 2018-06-22 ENCOUNTER — Encounter: Payer: Self-pay | Admitting: *Deleted

## 2018-06-22 ENCOUNTER — Other Ambulatory Visit: Payer: Self-pay

## 2018-06-22 NOTE — Patient Instructions (Signed)
Your procedure is scheduled on: 07-06-18 Report to Same Day Surgery 2nd floor medical mall [facility] Entrance-take elevator on left to 2nd floor.  Check in with surgery information desk.) To find out your arrival time please call [phone] between 1PM - 3PM on 07-03-18  Remember: Instructions that are not followed completely may result in serious medical risk, up to and including death, or upon the discretion of your surgeon and anesthesiologist your surgery may need to be rescheduled.    _x___ 1. Do not eat food after midnight the night before your procedure. You may drink clear liquids up to 2 hours before you are scheduled to arrive at the hospital for your procedure.  Do not drink clear liquids within 2 hours of your scheduled arrival to the hospital.  Clear liquids include  --Water or Apple juice without pulp  --Clear carbohydrate beverage such as ClearFast or Gatorade  --Black Coffee or Clear Tea (No milk, no creamers, do not add anything to the coffee or Tea   ____Ensure clear carbohydrate drink on the way to the hospital for bariatric patients  ____Ensure clear carbohydrate drink 3 hours before surgery for Dr Rutherford Nail patients if physician instructed.   No gum chewing or hard candies.     __x__ 2. No Alcohol for 24 hours before or after surgery.   __x__3. No Smoking or e-cigarettes for 24 prior to surgery.  Do not use any chewable tobacco products for at least 6 hour prior to surgery   ____  4. Bring all medications with you on the day of surgery if instructed.    __x__ 5. Notify your doctor if there is any change in your medical condition     (cold, fever, infections).    x___6. On the morning of surgery brush your teeth with toothpaste and water.  You may rinse your mouth with mouth wash if you wish.  Do not swallow any toothpaste or mouthwash.   Do not wear jewelry, make-up, hairpins, clips or nail polish.  Do not wear lotions, powders, or perfumes. You may wear  deodorant.  Do not shave 48 hours prior to surgery. Men may shave face and neck.  Do not bring valuables to the hospital.    [facility] is not responsible for any belongings or valuables.               Contacts, dentures or bridgework may not be worn into surgery.  Leave your suitcase in the car. After surgery it may be brought to your room.  For patients admitted to the hospital, discharge time is determined by your treatment team.  _  Patients discharged the day of surgery will not be allowed to drive home.  You will need someone to drive you home and stay with you the night of your procedure.    Please read over the following fact sheets that you were given:   [facility] Preparing for Surgery   _x___ TAKE THE FOLLOWING MEDICATION THE MORNING OF SURGERY WITH A SMALL SIP OF WATER. These include:  1. PAXIL  2. YOU MAY TAKE XANAX/VICODIN IF NEEDED DAY OF SURGERY   3.  4.  5.  6.  ____Fleets enema or Magnesium Citrate as directed.   ____ Use CHG Soap or sage wipes as directed on instruction sheet   ____ Use inhalers on the day of surgery and bring to hospital day of surgery  ____ Stop Metformin and Janumet 2 days prior to surgery.    ____  Take 1/2 of usual insulin dose the night before surgery and none on the morning surgery.   ____ Follow recommendations from Cardiologist, Pulmonologist or PCP regarding  stopping Aspirin, Coumadin, Plavix ,Eliquis, Effient, or Pradaxa, and Pletal.  X____Stop Anti-inflammatories such as Advil, Aleve, Ibuprofen, Motrin, Naproxen, Naprosyn, Goodies powders or aspirin products NOW-OK to take Tylenol    ____ Stop supplements until after surgery.     ____ Bring C-Pap to the hospital.

## 2018-06-23 ENCOUNTER — Ambulatory Visit: Payer: Self-pay | Admitting: General Surgery

## 2018-06-23 ENCOUNTER — Other Ambulatory Visit: Payer: BC Managed Care – PPO

## 2018-06-23 NOTE — H&P (Signed)
HISTORY OF PRESENT ILLNESS:    Pamela Taylor is a 53 y.o.female patient who comes for follow up of her due to been incited with abscess.  Patient had acute appendicitis with abscess that was percutaneously drained on May 11, 2018.  Patient was treated with antibiotic therapy and discharged home.  Since then she has been tolerating diet and the pain has been improving and being more comfortable.  The drain has been interrogated multiple times and shows persistent communication of the acid with the large intestine.  The abscess has resolved.  Patient has no fever and no significant abdominal pain.  Patient is able to walk 2 miles per day without chest pain, shortness of breath or abdominal pain.      PAST MEDICAL HISTORY:      Past Medical History:  Diagnosis Date  . Anxiety   . Depression         PAST SURGICAL HISTORY:        Past Surgical History:  Procedure Laterality Date  . CESAREAN SECTION  1990, 1992, 1993         MEDICATIONS:  EncounterMedications        Outpatient Encounter Medications as of 06/23/2018  Medication Sig Dispense Refill  . ALPRAZolam (XANAX) 0.5 MG tablet Take 0.5 mg by mouth once daily as needed       . HYDROcodone-acetaminophen (NORCO) 5-325 mg tablet Take 1 tablet by mouth every 4 (four) hours as needed for Pain    . PARoxetine (PAXIL) 20 MG tablet Take 20 mg by mouth once daily       . valACYclovir (VALTREX) 1000 MG tablet Take 1,000 mg by mouth 2 (two) times daily as needed        No facility-administered encounter medications on file as of 06/23/2018.        ALLERGIES:   Patient has no known allergies.   SOCIAL HISTORY:  Social History          Socioeconomic History  . Marital status: Married    Spouse name: Not on file  . Number of children: Not on file  . Years of education: Not on file  . Highest education level: Not on file  Occupational History  . Not on file  Social Needs  . Financial resource strain: Not on file   . Food insecurity:    Worry: Not on file    Inability: Not on file  . Transportation needs:    Medical: Not on file    Non-medical: Not on file  Tobacco Use  . Smoking status: Never Smoker  . Smokeless tobacco: Never Used  Substance and Sexual Activity  . Alcohol use: Yes    Comment: socially  . Drug use: Never  . Sexual activity: Not on file  Other Topics Concern  . Not on file  Social History Narrative  . Not on file      FAMILY HISTORY:       Family History  Problem Relation Age of Onset  . Breast cancer Mother   . COPD Father      GENERAL REVIEW OF SYSTEMS:   General ROS: negative for - chills, fatigue, fever, weight gain or weight loss Allergy and Immunology ROS: negative for - hives  Hematological and Lymphatic ROS: negative for - bleeding problems or bruising, negative for palpable nodes Endocrine ROS: negative for - heat or cold intolerance, hair changes Respiratory ROS: negative for - cough, shortness of breath or wheezing Cardiovascular ROS: no chest pain or   palpitations GI ROS: negative for nausea, vomiting, abdominal pain, diarrhea, positive for constipation Musculoskeletal ROS: negative for - joint swelling or muscle pain Neurological ROS: negative for - confusion, syncope Dermatological ROS: negative for pruritus and rash  PHYSICAL EXAM:  Vitals:   06/23/18 0915  BP: 122/85  Pulse: 82  .  Ht:157.5 cm (5\' 2" ) Wt:63.8 kg (140 lb 10.5 oz) UJW:JXBJ surface area is 1.67 meters squared. Body mass index is 25.73 kg/m.Marland Kitchen   GENERAL: Alert, active, oriented x3  HEENT: Pupils equal reactive to light. Extraocular movements are intact. Sclera clear. Palpebral conjunctiva normal red color.  NECK: Supple with no palpable mass and no adenopathy.  LUNGS: Sound clear with no rales rhonchi or wheezes.  HEART: Regular rhythm S1 and S2 without murmur.  ABDOMEN: Soft and depressible, nontender with no palpable mass, no hepatomegaly.  Right  lower quadrant drain in place with serous drainage.  EXTREMITIES: Well-developed well-nourished symmetrical with no dependent edema.  NEUROLOGICAL: Awake alert oriented, facial expression symmetrical, moving all extremities.      IMPRESSION:     Acute appendicitis with perforation, localized peritonitis, and abscess, without gangrene [K35.33] -Status post percutaneous drainage on May 11, 2018 -Completed antibiotic therapy -Abscess resolved but persistent to medication with appendix. -Patient oriented about recommendation of laparoscopic appendectomy versus right colectomy.  The rationale of these recommendation were explained to the patient oriented the patient about the persistent communication with the appendix.  If the perforation is close to the colon and appendectomy cannot be done safely, partial colectomy will need to be done.  Patient was oriented about the risks of surgery including ureter injury, small bowel injury, other organs injury, bowel obstruction, possibility of recurrent abscess, leak from staple line and anesthesia risk such as pneumonia, cardiac complications, DVT, among others.  The patient was oriented about the difficulty of the surgery due to the previous inflammation on the right lower quadrant with previous abscess, scar tissue and persistent medication to the appendix.   PLAN:  1. Laparoscopic appendectomy vs right colectomy 2. CBC 3. Bowel prep the day before surgery 4. Avoid aspirin 5 days before surgery.  5. Contact us if has any question or concern.   Patient verbalized understanding, all questions were answered, and were agreeable with the plan outlined above.   Carolan Shiver, MD  Electronically signed by Carolan Shiver, MD

## 2018-06-23 NOTE — H&P (View-Only) (Signed)
HISTORY OF PRESENT ILLNESS:    [name] is a 53 y.o.female patient who comes for follow up of her due to been incited with abscess.  Patient had acute appendicitis with abscess that was percutaneously drained on May 11, 2018.  Patient was treated with antibiotic therapy and discharged home.  Since then she has been tolerating diet and the pain has been improving and being more comfortable.  The drain has been interrogated multiple times and shows persistent communication of the acid with the large intestine.  The abscess has resolved.  Patient has no fever and no significant abdominal pain.  Patient is able to walk 2 miles per day without chest pain, shortness of breath or abdominal pain.      PAST MEDICAL HISTORY:      Past Medical History:  Diagnosis Date  . Anxiety   . Depression         PAST SURGICAL HISTORY:        Past Surgical History:  Procedure Laterality Date  . CESAREAN SECTION  1990, 1992, 1993         MEDICATIONS:  EncounterMedications        Outpatient Encounter Medications as of 06/23/2018  Medication Sig Dispense Refill  . ALPRAZolam (XANAX) 0.5 MG tablet Take 0.5 mg by mouth once daily as needed       . HYDROcodone-acetaminophen (NORCO) 5-325 mg tablet Take 1 tablet by mouth every 4 (four) hours as needed for Pain    . PARoxetine (PAXIL) 20 MG tablet Take 20 mg by mouth once daily       . valACYclovir (VALTREX) 1000 MG tablet Take 1,000 mg by mouth 2 (two) times daily as needed        No facility-administered encounter medications on file as of 06/23/2018.        ALLERGIES:   Patient has no known allergies.   SOCIAL HISTORY:  Social History          Socioeconomic History  . Marital status: Married    Spouse name: Not on file  . Number of children: Not on file  . Years of education: Not on file  . Highest education level: Not on file  Occupational History  . Not on file  Social Needs  . Financial resource strain: Not on file   . Food insecurity:    Worry: Not on file    Inability: Not on file  . Transportation needs:    Medical: Not on file    Non-medical: Not on file  Tobacco Use  . Smoking status: Never Smoker  . Smokeless tobacco: Never Used  Substance and Sexual Activity  . Alcohol use: Yes    Comment: socially  . Drug use: Never  . Sexual activity: Not on file  Other Topics Concern  . Not on file  Social History Narrative  . Not on file      FAMILY HISTORY:       Family History  Problem Relation Age of Onset  . Breast cancer Mother   . COPD Father      GENERAL REVIEW OF SYSTEMS:   General ROS: negative for - chills, fatigue, fever, weight gain or weight loss Allergy and Immunology ROS: negative for - hives  Hematological and Lymphatic ROS: negative for - bleeding problems or bruising, negative for palpable nodes Endocrine ROS: negative for - heat or cold intolerance, hair changes Respiratory ROS: negative for - cough, shortness of breath or wheezing Cardiovascular ROS: no chest pain or  palpitations GI ROS: negative for nausea, vomiting, abdominal pain, diarrhea, positive for constipation Musculoskeletal ROS: negative for - joint swelling or muscle pain Neurological ROS: negative for - confusion, syncope Dermatological ROS: negative for pruritus and rash  PHYSICAL EXAM:  Vitals:   06/23/18 0915  BP: 122/85  Pulse: 82  .  Ht:157.5 cm (5\' 2" ) Wt:63.8 kg (140 lb 10.5 oz) UJW:JXBJ surface area is 1.67 meters squared. Body mass index is 25.73 kg/m.Marland Kitchen   GENERAL: Alert, active, oriented x3  HEENT: Pupils equal reactive to light. Extraocular movements are intact. Sclera clear. Palpebral conjunctiva normal red color.  NECK: Supple with no palpable mass and no adenopathy.  LUNGS: Sound clear with no rales rhonchi or wheezes.  HEART: Regular rhythm S1 and S2 without murmur.  ABDOMEN: Soft and depressible, nontender with no palpable mass, no hepatomegaly.  Right  lower quadrant drain in place with serous drainage.  EXTREMITIES: Well-developed well-nourished symmetrical with no dependent edema.  NEUROLOGICAL: Awake alert oriented, facial expression symmetrical, moving all extremities.      IMPRESSION:     Acute appendicitis with perforation, localized peritonitis, and abscess, without gangrene [K35.33] -Status post percutaneous drainage on May 11, 2018 -Completed antibiotic therapy -Abscess resolved but persistent to medication with appendix. -Patient oriented about recommendation of laparoscopic appendectomy versus right colectomy.  The rationale of these recommendation were explained to the patient oriented the patient about the persistent communication with the appendix.  If the perforation is close to the colon and appendectomy cannot be done safely, partial colectomy will need to be done.  Patient was oriented about the risks of surgery including ureter injury, small bowel injury, other organs injury, bowel obstruction, possibility of recurrent abscess, leak from staple line and anesthesia risk such as pneumonia, cardiac complications, DVT, among others.  The patient was oriented about the difficulty of the surgery due to the previous inflammation on the right lower quadrant with previous abscess, scar tissue and persistent medication to the appendix.   PLAN:  1. Laparoscopic appendectomy vs right colectomy 2. CBC 3. Bowel prep the day before surgery 4. Avoid aspirin 5 days before surgery.  5. Contact us if has any question or concern.   Patient verbalized understanding, all questions were answered, and were agreeable with the plan outlined above.   Carolan Shiver, MD  Electronically signed by Carolan Shiver, MD

## 2018-07-05 MED ORDER — CEFAZOLIN SODIUM-DEXTROSE 2-4 GM/100ML-% IV SOLN
2.0000 g | INTRAVENOUS | Status: AC
  Administered 2018-07-06: 2 g via INTRAVENOUS

## 2018-07-06 ENCOUNTER — Other Ambulatory Visit: Payer: Self-pay

## 2018-07-06 ENCOUNTER — Ambulatory Visit: Payer: BC Managed Care – PPO | Admitting: Anesthesiology

## 2018-07-06 ENCOUNTER — Observation Stay
Admission: RE | Admit: 2018-07-06 | Discharge: 2018-07-07 | Disposition: A | Discharge status: Home / Self Care | Payer: BC Managed Care – PPO | Attending: General Surgery | Admitting: General Surgery

## 2018-07-06 ENCOUNTER — Encounter
Admission: RE | Disposition: A | Discharge status: Home / Self Care | Payer: Self-pay | Source: Home / Self Care | Attending: General Surgery

## 2018-07-06 ENCOUNTER — Encounter: Payer: Self-pay | Admitting: *Deleted

## 2018-07-06 DIAGNOSIS — K36 Other appendicitis: Secondary | ICD-10-CM | POA: Diagnosis present

## 2018-07-06 DIAGNOSIS — F329 Major depressive disorder, single episode, unspecified: Secondary | ICD-10-CM | POA: Diagnosis not present

## 2018-07-06 DIAGNOSIS — K3533 Acute appendicitis with perforation and localized peritonitis, with abscess: Secondary | ICD-10-CM | POA: Diagnosis not present

## 2018-07-06 DIAGNOSIS — F419 Anxiety disorder, unspecified: Secondary | ICD-10-CM | POA: Diagnosis not present

## 2018-07-06 DIAGNOSIS — K3580 Unspecified acute appendicitis: Secondary | ICD-10-CM | POA: Diagnosis present

## 2018-07-06 HISTORY — PX: LAPAROSCOPIC APPENDECTOMY: SHX408

## 2018-07-06 LAB — TYPE AND SCREEN
ABO/RH(D): A NEG
Antibody Screen: NEGATIVE

## 2018-07-06 SURGERY — APPENDECTOMY, LAPAROSCOPIC
Anesthesia: General

## 2018-07-06 MED ORDER — CEFAZOLIN SODIUM-DEXTROSE 2-4 GM/100ML-% IV SOLN
INTRAVENOUS | Status: AC
  Filled 2018-07-06: qty 100

## 2018-07-06 MED ORDER — LIDOCAINE HCL (CARDIAC) PF 100 MG/5ML IV SOSY
PREFILLED_SYRINGE | INTRAVENOUS | Status: DC | PRN
  Administered 2018-07-06: 100 mg via INTRAVENOUS

## 2018-07-06 MED ORDER — SUGAMMADEX SODIUM 200 MG/2ML IV SOLN
INTRAVENOUS | Status: DC | PRN
  Administered 2018-07-06: 127 mg via INTRAVENOUS

## 2018-07-06 MED ORDER — HYDROMORPHONE HCL 1 MG/ML IJ SOLN: 0.2500 mg | INTRAMUSCULAR | Status: DC | PRN

## 2018-07-06 MED ORDER — FENTANYL CITRATE (PF) 100 MCG/2ML IJ SOLN
INTRAMUSCULAR | Status: AC
  Filled 2018-07-06: qty 4

## 2018-07-06 MED ORDER — MIDAZOLAM HCL 2 MG/2ML IJ SOLN
INTRAMUSCULAR | Status: AC
  Filled 2018-07-06: qty 2

## 2018-07-06 MED ORDER — KETOROLAC TROMETHAMINE 30 MG/ML IJ SOLN
INTRAMUSCULAR | Status: DC | PRN
  Administered 2018-07-06: 30 mg via INTRAVENOUS

## 2018-07-06 MED ORDER — PROPOFOL 10 MG/ML IV BOLUS
INTRAVENOUS | Status: DC | PRN
  Administered 2018-07-06: 50 mg via INTRAVENOUS
  Administered 2018-07-06: 150 mg via INTRAVENOUS

## 2018-07-06 MED ORDER — MORPHINE SULFATE (PF) 2 MG/ML IV SOLN: 2.0000 mg | INTRAVENOUS | Status: DC | PRN

## 2018-07-06 MED ORDER — HYDROCODONE-ACETAMINOPHEN 5-325 MG PO TABS
1.0000 | ORAL_TABLET | ORAL | Status: DC | PRN
  Administered 2018-07-06 – 2018-07-07 (×3): 2 via ORAL
  Filled 2018-07-06 (×3): qty 2

## 2018-07-06 MED ORDER — FAMOTIDINE 20 MG PO TABS
20.0000 mg | ORAL_TABLET | Freq: Every day | ORAL | Status: DC
  Administered 2018-07-06 – 2018-07-07 (×2): 20 mg via ORAL
  Filled 2018-07-06 (×2): qty 1

## 2018-07-06 MED ORDER — PROPOFOL 10 MG/ML IV BOLUS
INTRAVENOUS | Status: AC
  Filled 2018-07-06: qty 20

## 2018-07-06 MED ORDER — ACETAMINOPHEN 10 MG/ML IV SOLN
INTRAVENOUS | Status: DC | PRN
  Administered 2018-07-06: 1000 mg via INTRAVENOUS

## 2018-07-06 MED ORDER — ROCURONIUM BROMIDE 100 MG/10ML IV SOLN
INTRAVENOUS | Status: DC | PRN
  Administered 2018-07-06 (×2): 10 mg via INTRAVENOUS
  Administered 2018-07-06: 20 mg via INTRAVENOUS
  Administered 2018-07-06: 40 mg via INTRAVENOUS

## 2018-07-06 MED ORDER — BUPIVACAINE-EPINEPHRINE 0.5% -1:200000 IJ SOLN
INTRAMUSCULAR | Status: DC | PRN
  Administered 2018-07-06: 14 mL

## 2018-07-06 MED ORDER — SODIUM CHLORIDE 0.9 % IV SOLN
2.0000 g | Freq: Two times a day (BID) | INTRAVENOUS | Status: AC
  Administered 2018-07-06: 2 g via INTRAVENOUS
  Filled 2018-07-06: qty 2

## 2018-07-06 MED ORDER — ONDANSETRON HCL 4 MG/2ML IJ SOLN: 4.0000 mg | Freq: Four times a day (QID) | INTRAMUSCULAR | Status: DC | PRN

## 2018-07-06 MED ORDER — ACETAMINOPHEN 325 MG PO TABS
650.0000 mg | ORAL_TABLET | Freq: Four times a day (QID) | ORAL | Status: DC | PRN
  Administered 2018-07-07: 650 mg via ORAL
  Filled 2018-07-06: qty 2

## 2018-07-06 MED ORDER — ACETAMINOPHEN 650 MG RE SUPP: 650.0000 mg | Freq: Four times a day (QID) | RECTAL | Status: DC | PRN

## 2018-07-06 MED ORDER — ONDANSETRON 4 MG PO TBDP: 4.0000 mg | ORAL_TABLET | Freq: Four times a day (QID) | ORAL | Status: DC | PRN

## 2018-07-06 MED ORDER — FENTANYL CITRATE (PF) 100 MCG/2ML IJ SOLN
INTRAMUSCULAR | Status: AC
  Filled 2018-07-06: qty 2

## 2018-07-06 MED ORDER — ONDANSETRON HCL 4 MG/2ML IJ SOLN
INTRAMUSCULAR | Status: DC | PRN
  Administered 2018-07-06 (×2): 4 mg via INTRAVENOUS

## 2018-07-06 MED ORDER — LACTATED RINGERS IV SOLN
INTRAVENOUS | Status: DC
  Administered 2018-07-06 (×2): via INTRAVENOUS

## 2018-07-06 MED ORDER — DEXAMETHASONE SODIUM PHOSPHATE 10 MG/ML IJ SOLN
INTRAMUSCULAR | Status: DC | PRN
  Administered 2018-07-06: 10 mg via INTRAVENOUS

## 2018-07-06 MED ORDER — FENTANYL CITRATE (PF) 100 MCG/2ML IJ SOLN
INTRAMUSCULAR | Status: DC | PRN
  Administered 2018-07-06 (×6): 50 ug via INTRAVENOUS

## 2018-07-06 MED ORDER — SUCCINYLCHOLINE CHLORIDE 20 MG/ML IJ SOLN
INTRAMUSCULAR | Status: DC | PRN
  Administered 2018-07-06: 20 mg via INTRAVENOUS
  Administered 2018-07-06: 100 mg via INTRAVENOUS

## 2018-07-06 MED ORDER — FAMOTIDINE 20 MG PO TABS
ORAL_TABLET | ORAL | Status: AC
  Administered 2018-07-06: 20 mg via ORAL
  Filled 2018-07-06: qty 1

## 2018-07-06 MED ORDER — ACETAMINOPHEN 10 MG/ML IV SOLN
INTRAVENOUS | Status: AC
  Filled 2018-07-06: qty 100

## 2018-07-06 MED ORDER — ENOXAPARIN SODIUM 40 MG/0.4ML ~~LOC~~ SOLN
40.0000 mg | SUBCUTANEOUS | Status: DC
  Administered 2018-07-07: 40 mg via SUBCUTANEOUS
  Filled 2018-07-06: qty 0.4

## 2018-07-06 MED ORDER — PHENYLEPHRINE HCL 10 MG/ML IJ SOLN
INTRAMUSCULAR | Status: DC | PRN
  Administered 2018-07-06 (×4): 100 ug via INTRAVENOUS

## 2018-07-06 MED ORDER — SODIUM CHLORIDE 0.9 % IV SOLN
INTRAVENOUS | Status: DC
  Administered 2018-07-06 – 2018-07-07 (×2): via INTRAVENOUS

## 2018-07-06 MED ORDER — ALPRAZOLAM 0.5 MG PO TABS
0.5000 mg | ORAL_TABLET | Freq: Every evening | ORAL | Status: DC | PRN
  Administered 2018-07-06: 0.5 mg via ORAL
  Filled 2018-07-06: qty 1

## 2018-07-06 MED ORDER — PROMETHAZINE HCL 25 MG/ML IJ SOLN: 6.2500 mg | INTRAMUSCULAR | Status: DC | PRN

## 2018-07-06 MED ORDER — MIDAZOLAM HCL 2 MG/2ML IJ SOLN
INTRAMUSCULAR | Status: DC | PRN
  Administered 2018-07-06: 2 mg via INTRAVENOUS

## 2018-07-06 MED ORDER — FAMOTIDINE 20 MG PO TABS
20.0000 mg | ORAL_TABLET | Freq: Once | ORAL | Status: AC
  Administered 2018-07-06: 20 mg via ORAL

## 2018-07-06 SURGICAL SUPPLY — 37 items
APPLIER CLIP LOGIC TI 5 (MISCELLANEOUS) ×2 IMPLANT
BLADE SURG SZ11 CARB STEEL (BLADE) ×2 IMPLANT
CANISTER SUCT 1200ML W/VALVE (MISCELLANEOUS) ×2 IMPLANT
CHLORAPREP W/TINT 26 (MISCELLANEOUS) ×2 IMPLANT
COVER WAND RF STERILE (DRAPES) IMPLANT
CUTTER FLEX LINEAR 45M (STAPLE) ×2 IMPLANT
DERMABOND ADVANCED (GAUZE/BANDAGES/DRESSINGS) ×1
DERMABOND ADVANCED .7 DNX12 (GAUZE/BANDAGES/DRESSINGS) ×1 IMPLANT
ELECT REM PT RETURN 9FT ADLT (ELECTROSURGICAL) ×2
ELECTRODE REM PT RTRN 9FT ADLT (ELECTROSURGICAL) ×1 IMPLANT
GLOVE BIO SURGEON STRL SZ 6.5 (GLOVE) ×2 IMPLANT
GOWN STRL REUS W/ TWL LRG LVL3 (GOWN DISPOSABLE) ×2 IMPLANT
GOWN STRL REUS W/TWL LRG LVL3 (GOWN DISPOSABLE) ×2
GRASPER SUT TROCAR 14GX15 (MISCELLANEOUS) ×2 IMPLANT
HANDLE YANKAUER SUCT BULB TIP (MISCELLANEOUS) ×2 IMPLANT
IRRIGATION STRYKERFLOW (MISCELLANEOUS) ×1 IMPLANT
IRRIGATOR STRYKERFLOW (MISCELLANEOUS) ×2
IV NS 1000ML (IV SOLUTION) ×1
IV NS 1000ML BAXH (IV SOLUTION) ×1 IMPLANT
KIT TURNOVER KIT A (KITS) ×2 IMPLANT
LIGASURE LAP MARYLAND 5MM 37CM (ELECTROSURGICAL) ×2 IMPLANT
NEEDLE HYPO 22GX1.5 SAFETY (NEEDLE) ×2 IMPLANT
NEEDLE VERESS 14GA 120MM (NEEDLE) ×2 IMPLANT
NS IRRIG 500ML POUR BTL (IV SOLUTION) ×2 IMPLANT
PACK LAP CHOLECYSTECTOMY (MISCELLANEOUS) ×2 IMPLANT
POUCH ENDO CATCH 10MM SPEC (MISCELLANEOUS) ×2 IMPLANT
RELOAD 45 VASCULAR/THIN (ENDOMECHANICALS) ×2 IMPLANT
RELOAD STAPLE TA45 3.5 REG BLU (ENDOMECHANICALS) ×4 IMPLANT
SCISSORS METZENBAUM CVD 33 (INSTRUMENTS) ×2 IMPLANT
SET TUBE SMOKE EVAC HIGH FLOW (TUBING) ×2 IMPLANT
SLEEVE ENDOPATH XCEL 5M (ENDOMECHANICALS) ×2 IMPLANT
SPONGE GAUZE 2X2 8PLY STRL LF (GAUZE/BANDAGES/DRESSINGS) ×2 IMPLANT
SUT MNCRL AB 4-0 PS2 18 (SUTURE) ×2 IMPLANT
SUT VICRYL PLUS ABS 0 54 (SUTURE) ×2 IMPLANT
TRAY FOLEY MTR SLVR 16FR STAT (SET/KITS/TRAYS/PACK) ×2 IMPLANT
TROCAR XCEL 12X100 BLDLESS (ENDOMECHANICALS) ×2 IMPLANT
TROCAR XCEL NON-BLD 5MMX100MML (ENDOMECHANICALS) ×2 IMPLANT

## 2018-07-06 NOTE — Anesthesia Post-op Follow-up Note (Signed)
Anesthesia QCDR form completed.        

## 2018-07-06 NOTE — OR Nursing (Signed)
Appendix drain removed per Dr. Hazle Quant

## 2018-07-06 NOTE — Anesthesia Preprocedure Evaluation (Addendum)
Anesthesia Evaluation  Patient identified by MRN, date of birth, ID band Patient awake    Reviewed: Allergy & Precautions, H&P , NPO status , Patient's Chart, lab work & pertinent test results  Airway Mallampati: III       Dental  (+) Teeth Intact   Pulmonary neg pulmonary ROS, neg COPD,           Cardiovascular (-) angina(-) Past MI negative cardio ROS  (-) dysrhythmias      Neuro/Psych PSYCHIATRIC DISORDERS Anxiety Depression negative neurological ROS     GI/Hepatic negative GI ROS, Neg liver ROS,   Endo/Other  negative endocrine ROS  Renal/GU      Musculoskeletal   Abdominal   Peds  Hematology  (+) Blood dyscrasia, anemia ,   Anesthesia Other Findings H/o ruptured appendicitis with abscess s/p percutaneous drain placement, presenting for appendectomy.  Past Medical History: 05/17/2009: Anemia No date: Depression No date: Hyperlipidemia  Past Surgical History: 1993: BTL [id number]: CESAREAN SECTION No date: EYE SURGERY No date: HAND SURGERY; Right     Comment:  TRIGGER FINGER 05/26/2018: IR RADIOLOGIST EVAL & MGMT 06/18/2018: IR RADIOLOGIST EVAL & MGMT 2003: REFRACTIVE SURGERY; Bilateral  BMI    Body Mass Index:  25.61 kg/m      Reproductive/Obstetrics negative OB ROS                           Anesthesia Physical Anesthesia Plan  ASA: II  Anesthesia Plan: General ETT   Post-op Pain Management:    Induction:   PONV Risk Score and Plan: Ondansetron, Dexamethasone, Midazolam and Treatment may vary due to age or medical condition  Airway Management Planned:   Additional Equipment:   Intra-op Plan:   Post-operative Plan:   Informed Consent: I have reviewed the patients History and Physical, chart, labs and discussed the procedure including the risks, benefits and alternatives for the proposed anesthesia with the patient or authorized representative who has  indicated his/her understanding and acceptance.     Dental Advisory Given  Plan Discussed with: Anesthesiologist and CRNA  Anesthesia Plan Comments:         Anesthesia Quick Evaluation

## 2018-07-06 NOTE — Progress Notes (Addendum)
Patient's spouse's cell phone [phone] Mable Fill.   Madie Reno, RN

## 2018-07-06 NOTE — Anesthesia Procedure Notes (Signed)
Procedure Name: Intubation Date/Time: 07/06/2018 9:05 AM Performed by: Nelda Marseille, CRNA Pre-anesthesia Checklist: Patient identified, Patient being monitored, Timeout performed, Emergency Drugs available and Suction available Patient Re-evaluated:Patient Re-evaluated prior to induction Oxygen Delivery Method: Circle system utilized Preoxygenation: Pre-oxygenation with 100% oxygen Induction Type: IV induction Ventilation: Mask ventilation without difficulty Laryngoscope Size: Mac, 3 and McGraph Grade View: Grade II Tube type: Oral Tube size: 7.0 mm Number of attempts: 2 Airway Equipment and Method: Stylet Placement Confirmation: ETT inserted through vocal cords under direct vision,  positive ETCO2 and breath sounds checked- equal and bilateral Secured at: 21 cm Tube secured with: Tape Dental Injury: Teeth and Oropharynx as per pre-operative assessment

## 2018-07-06 NOTE — Transfer of Care (Signed)
Immediate Anesthesia Transfer of Care Note  Patient: Pamela Taylor  Procedure(s) Performed: APPENDECTOMY LAPAROSCOPIC (N/A )  Patient Location: PACU  Anesthesia Type:General  Level of Consciousness: awake, alert  and oriented  Airway & Oxygen Therapy: Patient Spontanous Breathing and Patient connected to face mask oxygen  Post-op Assessment: Report given to RN and Post -op Vital signs reviewed and stable  Post vital signs: Reviewed and stable  Last Vitals:  Vitals Value Taken Time  BP 105/58 07/06/2018 12:06 PM  Temp 36.7 C 07/06/2018 12:06 PM  Pulse 91 07/06/2018 12:06 PM  Resp 30 07/06/2018 12:06 PM  SpO2 100 % 07/06/2018 12:06 PM  Vitals shown include unvalidated device data.  Last Pain:  Vitals:   07/06/18 0819  TempSrc: Temporal  PainSc: 0-No pain         Complications: No apparent anesthesia complications

## 2018-07-06 NOTE — Op Note (Signed)
Preoperative diagnosis: History of appendicitis with abscess s/p percutaneous drainage.   Postoperative diagnosis: History of appendicitis with abscess   Procedure: Laparoscopic appendectomy.  Anesthesia: GETA  Surgeon: Dr. Hazle Quant, MD  Wound Classification: Contaminated  Indications: Patient is a 53 y.o. female  presented with history of acute appendicitis with abscess that was treated with IV antibiotic and percutaneous drainage. Abscess resolved but patient had persistent communication of abscess cavity and large intestine as per drainage study. Appendectomy indicated as treatment of persistent communication appendix and large intestine.   Findings: 1. Chronically inflamed appendix with drain in place 2. There is persistent opening of the appendix on mid appendix with a healthy cecum base.  3. No peri-appendiceal abscess or phlegmon 4. Normal anatomy 5. Adequate hemostasis.   Description of procedure: The patient was placed on the operating table in the supine position. General anesthesia was induced. A time-out was completed verifying correct patient, procedure, site, positioning, and implant(s) and/or special equipment prior to beginning this procedure. A Foley catheter and orogastric tubes were placed. The abdomen was prepped and draped in the usual sterile fashion.  An incision was made above the umbilicus.  The fascia was elevated and the Veress needle inserted. Proper position was confirmed by aspiration and saline meniscus test. The abdomen was insufflated with carbon dioxide to a pressure of 15 mmHg. The patient tolerated insufflation well. A 12-mm optiview trocar was then inserted supraumbilically. The laparoscope was inserted and the abdomen inspected. No injuries from initial trocar placement were noted. Turbid fluid was noted in the right lower quadrant. Under direct visualization, an 5-mm trocar was inserted in the left lower quadrant lateral to the rectus muscle. A 5-mm  port was then placed above the symphysis pubis on midline.  Care was taken to avoid injury to the bladder or inferior epigastric vessels. The table was placed in the Trendelenburg position with the right side elevated.  The right lower quadrant was found with significant adhesions of omentum.  The adhesions were soft to the abdominal wall and they were dissected bluntly.  The omentum was dissected from the cecum and the appendix and retracted cephalad.  The ileum was also separated from the right lower quadrant.  Careful dissection was done following the drain until the peritoneum was identified.  At this point the appendix was found to be open and appendicolith fell from the appendix.  This was not able to be found.  The mesoappendix was divided with the LigaSure until the base of the cecum was identified.  At this point the base of the appendix was able to be identified separate from the cecum and ileocecal valve. The cecum was gently grasped with an endoscopic graspers and pulled toward (the left upper quadrant). The appendix was then grasped and elevated. It was noted to be thickened.  An endoscopic linear cutting stapler was then used to divide and staple the base of the appendix. The appendix was placed in an endoscopic retrieval bag and removed.  The appendiceal stump was then irrigated and hemostasis was assured. Fluid was suctioned and no other pathology was identified.  A second look of the appendicolith was done mobilizing the small bowel looking in all quadrants including the pelvis and was not able to be identified. Secondary trocars were removed under direct vision. No bleeding was noted. The laparoscope was withdrawn and the umbilical trocar removed. The abdomen was allowed to collapse. All trocar sites greater than 5 mm were closed with Vicryl 0. The skin was closed  with subcuticular sutures Monocryl 4-0 of and steristrips.  The patient tolerated the procedure well and was taken to the  postanesthesia care unit in satisfactory condition.   Specimen: Appendix  Complications: None  Estimated Blood Loss: 30 mL

## 2018-07-06 NOTE — Interval H&P Note (Signed)
History and Physical Interval Note:  07/06/2018 7:59 AM  Pamela Taylor  has presented today for surgery, with the diagnosis of Abscess w/o gangrene S/P appendicitis with perforation.  The various methods of treatment have been discussed with the patient and family. After consideration of risks, benefits and other options for treatment, the patient has consented to  Procedure(s): APPENDECTOMY LAPAROSCOPIC (N/A) as a surgical intervention.  The patient's history has been reviewed, patient examined, no change in status, stable for surgery.  I have reviewed the patient's chart and labs.  Questions were answered to the patient's satisfaction.     Carolan Shiver

## 2018-07-07 DIAGNOSIS — K3533 Acute appendicitis with perforation and localized peritonitis, with abscess: Secondary | ICD-10-CM | POA: Diagnosis not present

## 2018-07-07 LAB — SURGICAL PATHOLOGY

## 2018-07-07 MED ORDER — HYDROCODONE-ACETAMINOPHEN 5-325 MG PO TABS: 1.0000 | ORAL_TABLET | ORAL | 0 refills | Status: AC | PRN

## 2018-07-07 NOTE — Progress Notes (Signed)
MD notified: Pamela Taylor, the patient indicated she felt a little dizzy a for a few minutes after walking and sitting on the chair. BP a little softer than it has been 97/61 (71), HR 62, oxygen 97%

## 2018-07-07 NOTE — Discharge Instructions (Signed)
°  Diet: Resume home heart healthy regular diet.   Activity: No heavy lifting >20 pounds (children, pets, laundry, garbage) or strenuous activity until follow-up, but light activity and walking are encouraged. Do not drive or drink alcohol if taking narcotic pain medications.  Wound care: May shower with soapy water and pat dry (do not rub incisions), but no baths or submerging incision underwater until follow-up. (no swimming)   Medications: Resume all home medications. For mild to moderate pain: acetaminophen (Tylenol) or ibuprofen (if no kidney disease). Combining Tylenol with alcohol can substantially increase your risk of causing liver disease. Narcotic pain medications, if prescribed, can be used for severe pain, though may cause nausea, constipation, and drowsiness. Do not combine Tylenol and Norco within a 6 hour period as Norco contains Tylenol. If you do not need the narcotic pain medication, you do not need to fill the prescription.  Take the Augmenting previously prescribed as ordered.   Call office [phone]) at any time if any questions, worsening pain, fevers/chills, bleeding, drainage from incision site, or other concerns.

## 2018-07-07 NOTE — Progress Notes (Signed)
The patient indicated she tolerated the soft diet order this morning for breakfast. The patient has ambulated in the hallway two different times today.

## 2018-07-07 NOTE — Plan of Care (Signed)
  Problem: Education: Goal: Knowledge of General Education information will improve Description: Including pain rating scale, medication(s)/side effects and non-pharmacologic comfort measures Outcome: Progressing   Problem: Activity: Goal: Risk for activity intolerance will decrease Outcome: Progressing   Problem: Nutrition: Goal: Adequate nutrition will be maintained Outcome: Progressing   

## 2018-07-07 NOTE — Progress Notes (Signed)
Surgeon notified: FYI, the patient BP still soft 91/59, HR 70. She just had some dizziness that comes and goes but she is not know if part can be because of being anxious.

## 2018-07-07 NOTE — Discharge Summary (Signed)
  Patient ID: Pamela Taylor MRN: [id number] DOB/AGE: Oct 02, 1965 53 y.o.  Admit date: 07/06/2018 Discharge date: 07/07/2018   Discharge Diagnoses:  Active Problems:   Chronic appendicitis   Procedures: Laparoscopic appendectomy  Hospital Course: Patient with history of perforated appendicitis status post drainage to resolve with IV antibiotics.  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  Laparoscopic appendectomy was done.  Patient tolerated well.  Patient today with control pain with current pain medication and tolerating diet.  She is voiding spontaneously and having bowel gas. Physical Exam  Constitutional: She is well-developed, well-nourished, and in no distress.  Cardiovascular: Normal rate.  Pulmonary/Chest: Effort normal.  Abdominal: Soft. Bowel sounds are normal.  Wounds are dry and clean.     Consults: None  Disposition: Discharge disposition: 01-Home or Self Care       Discharge Instructions    Diet - low sodium heart healthy   Complete by:  As directed    Increase activity slowly   Complete by:  As directed      Allergies as of 07/07/2018   No Known Allergies     Medication List    TAKE these medications   albuterol 108 (90 Base) MCG/ACT inhaler Commonly known as:  PROVENTIL HFA;VENTOLIN HFA Inhale 2 puffs into the lungs every 6 (six) hours as needed for wheezing or shortness of breath.   ALPRAZolam 0.5 MG tablet Commonly known as:  XANAX TAKE 1 TABLET (0.5 MG TOTAL) BY MOUTH AT BEDTIME AS NEEDED FOR ANXIETY.   HYDROcodone-acetaminophen 5-325 MG tablet Commonly known as:  NORCO/VICODIN Take 1 tablet by mouth every 4 (four) hours as needed for moderate pain. What changed:  Another medication with the same name was added. Make sure you understand how and when to take each.   HYDROcodone-acetaminophen 5-325 MG tablet Commonly known as:  Norco Take 1 tablet by mouth every 4 (four) hours as needed for up to 3 days for moderate pain. What  changed:  You were already taking a medication with the same name, and this prescription was added. Make sure you understand how and when to take each.   PARoxetine 20 MG tablet Commonly known as:  PAXIL Take 1 tablet (20 mg total) by mouth daily. What changed:  when to take this   valACYclovir 1000 MG tablet Commonly known as:  VALTREX TAKE 1 TABLET (1,000 MG TOTAL) BY MOUTH DAILY. AS NEEDED FOR COLD SORES. What changed:    when to take this  reasons to take this  additional instructions      Follow-up Information    Carolan Shiver, MD Follow up in 2 week(s).   Specialty:  General Surgery Contact information: [address] [place] [address] [phone]

## 2018-07-08 NOTE — Anesthesia Postprocedure Evaluation (Signed)
Anesthesia Post Note  Patient: Pamela Taylor  Procedure(s) Performed: APPENDECTOMY LAPAROSCOPIC (N/A )  Patient location during evaluation: PACU Anesthesia Type: General Level of consciousness: awake and alert Pain management: pain level controlled Vital Signs Assessment: post-procedure vital signs reviewed and stable Respiratory status: spontaneous breathing, nonlabored ventilation and respiratory function stable Cardiovascular status: blood pressure returned to baseline and stable Postop Assessment: no apparent nausea or vomiting Anesthetic complications: no     Last Vitals:  Vitals:   07/07/18 0808 07/07/18 1203  BP: 97/61 (!) 91/59  Pulse: 62 70  Resp:  17  Temp: 36.5 C 36.8 C  SpO2: 97% 98%    Last Pain:  Vitals:   07/07/18 1203  TempSrc: Axillary  PainSc:                  Jovita Gamma

## 2018-07-10 ENCOUNTER — Other Ambulatory Visit
Admission: RE | Admit: 2018-07-10 | Discharge: 2018-07-10 | Disposition: A | Discharge status: Home / Self Care | Payer: BC Managed Care – PPO | Source: Ambulatory Visit | Attending: General Surgery | Admitting: General Surgery

## 2018-07-10 DIAGNOSIS — R197 Diarrhea, unspecified: Secondary | ICD-10-CM | POA: Insufficient documentation

## 2018-07-10 LAB — C DIFFICILE QUICK SCREEN W PCR REFLEX
C Diff antigen: NEGATIVE
C Diff interpretation: NOT DETECTED
C Diff toxin: NEGATIVE

## 2018-10-26 ENCOUNTER — Telehealth: Payer: Self-pay

## 2018-10-26 NOTE — Telephone Encounter (Signed)
Alright, we can go ahead and call her and set up CPE with me (make sure it is one year from last one)

## 2018-10-26 NOTE — Telephone Encounter (Signed)
Yes I am ok. She would be due for CPE. She is very compliant and very nice. I think I inherited her from Dr. Venia Minks. She likes seeing MDs regularly.

## 2018-10-26 NOTE — Telephone Encounter (Signed)
Patient requesting to transfer care from [place] to Dr. Jacinto Reap. Please advise.

## 2018-10-26 NOTE — Telephone Encounter (Signed)
I'm ok with it.  Thoughts, Tawanna Sat?  It looks like she hasn't switched amongst providers before.  It looks like it's been almost 1 yr since last OV, so will probably need a CPE or OV.

## 2018-10-27 ENCOUNTER — Ambulatory Visit (INDEPENDENT_AMBULATORY_CARE_PROVIDER_SITE_OTHER): Payer: BC Managed Care – PPO | Admitting: Physician Assistant

## 2018-10-27 ENCOUNTER — Telehealth: Payer: Self-pay | Admitting: Physician Assistant

## 2018-10-27 ENCOUNTER — Other Ambulatory Visit: Payer: Self-pay

## 2018-10-27 ENCOUNTER — Other Ambulatory Visit
Admission: RE | Admit: 2018-10-27 | Discharge: 2018-10-27 | Disposition: A | Discharge status: Home / Self Care | Payer: BC Managed Care – PPO | Source: Ambulatory Visit | Attending: Physician Assistant | Admitting: Physician Assistant

## 2018-10-27 ENCOUNTER — Ambulatory Visit: Payer: BC Managed Care – PPO | Admitting: Physician Assistant

## 2018-10-27 ENCOUNTER — Telehealth: Payer: Self-pay | Admitting: *Deleted

## 2018-10-27 DIAGNOSIS — R509 Fever, unspecified: Secondary | ICD-10-CM

## 2018-10-27 DIAGNOSIS — R109 Unspecified abdominal pain: Secondary | ICD-10-CM

## 2018-10-27 DIAGNOSIS — Z1159 Encounter for screening for other viral diseases: Secondary | ICD-10-CM | POA: Insufficient documentation

## 2018-10-27 DIAGNOSIS — Z20822 Contact with and (suspected) exposure to covid-19: Secondary | ICD-10-CM

## 2018-10-27 LAB — COMPREHENSIVE METABOLIC PANEL
ALT: 57 U/L — ABNORMAL HIGH (ref 0–44)
AST: 52 U/L — ABNORMAL HIGH (ref 15–41)
Albumin: 3.7 g/dL (ref 3.5–5.0)
Alkaline Phosphatase: 141 U/L — ABNORMAL HIGH (ref 38–126)
Anion gap: 9 (ref 5–15)
BUN: 12 mg/dL (ref 6–20)
CO2: 27 mmol/L (ref 22–32)
Calcium: 9.1 mg/dL (ref 8.9–10.3)
Chloride: 104 mmol/L (ref 98–111)
Creatinine, Ser: 0.48 mg/dL (ref 0.44–1.00)
GFR calc Af Amer: >60 mL/min (ref >60)
GFR calc non Af Amer: >60 mL/min (ref >60)
Glucose, Bld: 119 mg/dL — ABNORMAL HIGH (ref 70–99)
Potassium: 4 mmol/L (ref 3.5–5.1)
Sodium: 140 mmol/L (ref 135–145)
Total Bilirubin: 0.5 mg/dL (ref 0.3–1.2)
Total Protein: 7.4 g/dL (ref 6.5–8.1)

## 2018-10-27 LAB — URINALYSIS, ROUTINE W REFLEX MICROSCOPIC
Bacteria, UA: NONE SEEN
Bilirubin Urine: NEGATIVE
Glucose, UA: NEGATIVE mg/dL
Ketones, ur: NEGATIVE mg/dL
Leukocytes,Ua: NEGATIVE
Nitrite: NEGATIVE
Protein, ur: NEGATIVE mg/dL
Specific Gravity, Urine: 1.026 (ref 1.005–1.030)
pH: 5 (ref 5.0–8.0)

## 2018-10-27 LAB — CBC WITH DIFFERENTIAL/PLATELET
Abs Immature Granulocytes: 0.02 10*3/uL (ref 0.00–0.07)
Basophils Absolute: 0 10*3/uL (ref 0.0–0.1)
Basophils Relative: 1 %
Eosinophils Absolute: 0.2 10*3/uL (ref 0.0–0.5)
Eosinophils Relative: 2 %
HCT: 38.9 % (ref 36.0–46.0)
Hemoglobin: 12.9 g/dL (ref 12.0–15.0)
Immature Granulocytes: 0 %
Lymphocytes Relative: 22 %
Lymphs Abs: 1.9 10*3/uL (ref 0.7–4.0)
MCH: 31.4 pg (ref 26.0–34.0)
MCHC: 33.2 g/dL (ref 30.0–36.0)
MCV: 94.6 fL (ref 80.0–100.0)
Monocytes Absolute: 0.9 10*3/uL (ref 0.1–1.0)
Monocytes Relative: 10 %
Neutro Abs: 5.8 10*3/uL (ref 1.7–7.7)
Neutrophils Relative %: 65 %
Platelets: 280 10*3/uL (ref 150–400)
RBC: 4.11 MIL/uL (ref 3.87–5.11)
RDW: 12.6 % (ref 11.5–15.5)
WBC: 8.8 10*3/uL (ref 4.0–10.5)
nRBC: 0 % (ref 0.0–0.2)

## 2018-10-27 LAB — LIPASE, BLOOD: Lipase: 27 U/L (ref 11–51)

## 2018-10-27 LAB — AMYLASE: Amylase: 28 U/L (ref 28–100)

## 2018-10-27 NOTE — Telephone Encounter (Signed)
Scheduled for today @ 12:45 at [facility] Pt was already in line to be tested. Order placed

## 2018-10-27 NOTE — Telephone Encounter (Signed)
Pt is scheduled to see Fabio Bering this afternoon but last night pt started running a fever of 101. Pt stated that she took a fever reducer at 8 pm last night and has no fever this morning. Pt is asking if she can still come in for her appt. Please advise. Thanks TNP

## 2018-10-27 NOTE — Progress Notes (Signed)
Patient: Pamela Simmondslizabeth Taylor Female    DOB: March 16, 1966   53 y.o.   MRN: [id number] Visit Date: 10/28/2018  Today's Provider: Trey SailorsAdriana M Mohogany Toppins, PA-C   Chief Complaint  Patient presents with  . Abdominal Pain  . Fever   Subjective:    Virtual Visit via Video Note  I connected with Pamela SimmondsElizabeth Shall on 10/28/18 at 11:20 AM EDT by a video enabled telemedicine application and verified that I am speaking with the correct person using two identifiers.   I discussed the limitations of evaluation and management by telemedicine and the availability of in person appointments. The patient expressed understanding and agreed to proceed.   Patient location: home Provider [facility]  Persons involved in the visit: patient, provider   Patient with history of appendectomy 06/2018 after treatment for appendiceal abscess presents today with periumbilical abdominal pain. She had been exercising doing paddle boarding in the days prior. Descriptors below. Reports difficult time sleeping. Had fever of 101F and did take some antipyretics, fever gone today. Denies dysuria, urinary frequency, constipation, diarrhea, nausea or vomiting. Prior CT from admission for appendectomy did not show gallstones.   Abdominal Pain This is a new problem. Episode onset: Sunday. The onset quality is sudden. The problem occurs constantly. The problem has been unchanged. The pain is located in the periumbilical region and suprapubic region. Quality: a sensation of a "pulled muscle"  Associated symptoms include a fever. Associated symptoms comments: sweats. Treatments tried: Ibuprofen. The treatment provided mild relief. appendectomy March 23rd    No Known Allergies   Current Outpatient Medications:  .  albuterol (PROVENTIL HFA;VENTOLIN HFA) 108 (90 Base) MCG/ACT inhaler, Inhale 2 puffs into the lungs every 6 (six) hours as needed for wheezing or shortness of breath. (Patient not taking:  Reported on 05/11/2018), Disp: 1 Inhaler, Rfl: 2 .  ALPRAZolam (XANAX) 0.5 MG tablet, TAKE 1 TABLET (0.5 MG TOTAL) BY MOUTH AT BEDTIME AS NEEDED FOR ANXIETY., Disp: 90 tablet, Rfl: 1 .  HYDROcodone-acetaminophen (NORCO/VICODIN) 5-325 MG tablet, Take 1 tablet by mouth every 4 (four) hours as needed for moderate pain. (Patient not taking: Reported on 07/06/2018), Disp: 20 tablet, Rfl: 0 .  PARoxetine (PAXIL) 20 MG tablet, Take 1 tablet (20 mg total) by mouth daily. (Patient taking differently: Take 20 mg by mouth every morning. ), Disp: 90 tablet, Rfl: 4 .  valACYclovir (VALTREX) 1000 MG tablet, TAKE 1 TABLET (1,000 MG TOTAL) BY MOUTH DAILY. AS NEEDED FOR COLD SORES. (Patient taking differently: Take 1,000 mg by mouth daily as needed (cold sores). ), Disp: 90 tablet, Rfl: 1  Review of Systems  Constitutional: Positive for diaphoresis and fever.  Respiratory: Negative.   Cardiovascular: Negative.   Gastrointestinal: Positive for abdominal pain.  Musculoskeletal: Negative.     Social History   Tobacco Use  . Smoking status: Never Smoker  . Smokeless tobacco: Never Used  Substance Use Topics  . Alcohol use: Yes    Comment: Occasional       Objective:   LMP 11/14/2015  There were no vitals filed for this visit.   Physical Exam Constitutional:      Appearance: She is well-developed.  Neurological:     Mental Status: She is alert.  Psychiatric:        Mood and Affect: Mood normal.        Behavior: Behavior normal.      Results for orders placed or performed during the hospital encounter of 10/27/18  Urinalysis, Routine w reflex microscopic  Result Value Ref Range   Color, Urine YELLOW (A) YELLOW   APPearance CLEAR (A) CLEAR   Specific Gravity, Urine 1.026 1.005 - 1.030   pH 5.0 5.0 - 8.0   Glucose, UA NEGATIVE NEGATIVE mg/dL   Hgb urine dipstick MODERATE (A) NEGATIVE   Bilirubin Urine NEGATIVE NEGATIVE   Ketones, ur NEGATIVE NEGATIVE mg/dL   Protein, ur NEGATIVE NEGATIVE  mg/dL   Nitrite NEGATIVE NEGATIVE   Leukocytes,Ua NEGATIVE NEGATIVE   RBC / HPF 6-10 0 - 5 RBC/hpf   WBC, UA 0-5 0 - 5 WBC/hpf   Bacteria, UA NONE SEEN NONE SEEN   Squamous Epithelial / LPF 0-5 0 - 5   Mucus PRESENT   Amylase  Result Value Ref Range   Amylase 28 28 - 100 U/L  Lipase, blood  Result Value Ref Range   Lipase 27 11 - 51 U/L  CBC with Differential/Platelet  Result Value Ref Range   WBC 8.8 4.0 - 10.5 K/uL   RBC 4.11 3.87 - 5.11 MIL/uL   Hemoglobin 12.9 12.0 - 15.0 g/dL   HCT 16.138.9 09.636.0 - 04.546.0 %   MCV 94.6 80.0 - 100.0 fL   MCH 31.4 26.0 - 34.0 pg   MCHC 33.2 30.0 - 36.0 g/dL   RDW 40.912.6 81.111.5 - 91.415.5 %   Platelets 280 150 - 400 K/uL   nRBC 0.0 0.0 - 0.2 %   Neutrophils Relative % 65 %   Neutro Abs 5.8 1.7 - 7.7 K/uL   Lymphocytes Relative 22 %   Lymphs Abs 1.9 0.7 - 4.0 K/uL   Monocytes Relative 10 %   Monocytes Absolute 0.9 0.1 - 1.0 K/uL   Eosinophils Relative 2 %   Eosinophils Absolute 0.2 0.0 - 0.5 K/uL   Basophils Relative 1 %   Basophils Absolute 0.0 0.0 - 0.1 K/uL   Immature Granulocytes 0 %   Abs Immature Granulocytes 0.02 0.00 - 0.07 K/uL  Comprehensive metabolic panel  Result Value Ref Range   Sodium 140 135 - 145 mmol/L   Potassium 4.0 3.5 - 5.1 mmol/L   Chloride 104 98 - 111 mmol/L   CO2 27 22 - 32 mmol/L   Glucose, Bld 119 (H) 70 - 99 mg/dL   BUN 12 6 - 20 mg/dL   Creatinine, Ser 7.820.48 0.44 - 1.00 mg/dL   Calcium 9.1 8.9 - 95.610.3 mg/dL   Total Protein 7.4 6.5 - 8.1 g/dL   Albumin 3.7 3.5 - 5.0 g/dL   AST 52 (H) 15 - 41 U/L   ALT 57 (H) 0 - 44 U/L   Alkaline Phosphatase 141 (H) 38 - 126 U/L   Total Bilirubin 0.5 0.3 - 1.2 mg/dL   GFR calc non Af Amer >60 >60 mL/min   GFR calc Af Amer >60 >60 mL/min   Anion gap 9 5 - 15       Assessment & Plan    1. Abdominal pain, unspecified abdominal location  DDx: MSK, gastritis, constipation, pancreatitis, cholecystitis, GERD. Will get labs as below and send for COVID testing. Patient will let us  know if she would like to proceed with imaging.  - Comprehensive Metabolic Panel (CMET) - CBC with Differential - Lipase - Amylase - Urinalysis  2. Fever, unspecified fever cause  The entirety of the information documented in the History of Present Illness, Review of Systems and Physical Exam were personally obtained by me. Portions of this information were initially documented by  Lynford Humphrey, CMA and reviewed by me for thoroughness and accuracy.   F/u PRN  I have spent 25 minutes with this patient, >50% of which was spent on counseling and coordination of care.     Trinna Post, PA-C  [facility]

## 2018-10-27 NOTE — Telephone Encounter (Signed)
SCHEDULED DOXY.ME

## 2018-10-27 NOTE — Telephone Encounter (Signed)
Patient scheduled.

## 2018-10-28 NOTE — Patient Instructions (Signed)
Abdominal Pain, Adult    Many things can cause belly (abdominal) pain. Most times, belly pain is not dangerous. Many cases of belly pain can be watched and treated at home. Sometimes belly pain is serious, though. Your doctor will try to find the cause of your belly pain.  Follow these instructions at home:  · Take over-the-counter and prescription medicines only as told by your doctor. Do not take medicines that help you poop (laxatives) unless told to by your doctor.  · Drink enough fluid to keep your pee (urine) clear or pale yellow.  · Watch your belly pain for any changes.  · Keep all follow-up visits as told by your doctor. This is important.  Contact a doctor if:  · Your belly pain changes or gets worse.  · You are not hungry, or you lose weight without trying.  · You are having trouble pooping (constipated) or have watery poop (diarrhea) for more than 2-3 days.  · You have pain when you pee or poop.  · Your belly pain wakes you up at night.  · Your pain gets worse with meals, after eating, or with certain foods.  · You are throwing up and cannot keep anything down.  · You have a fever.  Get help right away if:  · Your pain does not go away as soon as your doctor says it should.  · You cannot stop throwing up.  · Your pain is only in areas of your belly, such as the right side or the left lower part of the belly.  · You have bloody or black poop, or poop that looks like tar.  · You have very bad pain, cramping, or bloating in your belly.  · You have signs of not having enough fluid or water in your body (dehydration), such as:  ? Dark pee, very little pee, or no pee.  ? Cracked lips.  ? Dry mouth.  ? Sunken eyes.  ? Sleepiness.  ? Weakness.  This information is not intended to replace advice given to you by your health care provider. Make sure you discuss any questions you have with your health care provider.  Document Released: 09/18/2007 Document Revised: 10/20/2015 Document Reviewed: 09/13/2015  Elsevier  Interactive Patient Education © 2020 [employer]

## 2018-10-29 ENCOUNTER — Other Ambulatory Visit: Payer: Self-pay

## 2018-10-29 ENCOUNTER — Encounter: Payer: Self-pay | Admitting: Physician Assistant

## 2018-10-29 ENCOUNTER — Ambulatory Visit
Admission: RE | Admit: 2018-10-29 | Discharge: 2018-10-29 | Disposition: A | Discharge status: Home / Self Care | Payer: BC Managed Care – PPO | Source: Ambulatory Visit | Attending: Physician Assistant | Admitting: Physician Assistant

## 2018-10-29 ENCOUNTER — Inpatient Hospital Stay
Admission: AD | Admit: 2018-10-29 | Discharge: 2018-11-01 | DRG: 358 | Disposition: A | Discharge status: Home / Self Care | Payer: BC Managed Care – PPO | Source: Ambulatory Visit | Attending: General Surgery | Admitting: General Surgery

## 2018-10-29 ENCOUNTER — Telehealth: Payer: Self-pay | Admitting: Physician Assistant

## 2018-10-29 DIAGNOSIS — K651 Peritoneal abscess: Secondary | ICD-10-CM | POA: Diagnosis present

## 2018-10-29 DIAGNOSIS — D649 Anemia, unspecified: Secondary | ICD-10-CM | POA: Diagnosis present

## 2018-10-29 DIAGNOSIS — R509 Fever, unspecified: Secondary | ICD-10-CM | POA: Insufficient documentation

## 2018-10-29 DIAGNOSIS — R1033 Periumbilical pain: Secondary | ICD-10-CM | POA: Insufficient documentation

## 2018-10-29 DIAGNOSIS — K381 Appendicular concretions: Secondary | ICD-10-CM | POA: Diagnosis present

## 2018-10-29 DIAGNOSIS — F419 Anxiety disorder, unspecified: Secondary | ICD-10-CM | POA: Diagnosis present

## 2018-10-29 DIAGNOSIS — Z79899 Other long term (current) drug therapy: Secondary | ICD-10-CM | POA: Diagnosis not present

## 2018-10-29 DIAGNOSIS — K5641 Fecal impaction: Secondary | ICD-10-CM | POA: Diagnosis present

## 2018-10-29 DIAGNOSIS — Z03818 Encounter for observation for suspected exposure to other biological agents ruled out: Secondary | ICD-10-CM | POA: Diagnosis not present

## 2018-10-29 DIAGNOSIS — F329 Major depressive disorder, single episode, unspecified: Secondary | ICD-10-CM | POA: Diagnosis present

## 2018-10-29 DIAGNOSIS — R109 Unspecified abdominal pain: Secondary | ICD-10-CM | POA: Diagnosis present

## 2018-10-29 DIAGNOSIS — E785 Hyperlipidemia, unspecified: Secondary | ICD-10-CM | POA: Diagnosis present

## 2018-10-29 LAB — SURGICAL PCR SCREEN
MRSA, PCR: NEGATIVE
Staphylococcus aureus: NEGATIVE

## 2018-10-29 LAB — SARS CORONAVIRUS 2 BY RT PCR (HOSPITAL ORDER, PERFORMED IN ~~LOC~~ HOSPITAL LAB): SARS Coronavirus 2: NEGATIVE

## 2018-10-29 MED ORDER — SODIUM CHLORIDE 0.9 % IV SOLN
INTRAVENOUS | Status: DC | PRN
  Administered 2018-10-29: 22:00:00 250 mL via INTRAVENOUS

## 2018-10-29 MED ORDER — ENOXAPARIN SODIUM 40 MG/0.4ML ~~LOC~~ SOLN
40.0000 mg | SUBCUTANEOUS | Status: DC
  Administered 2018-10-29 – 2018-10-31 (×3): 40 mg via SUBCUTANEOUS
  Filled 2018-10-29 (×2): qty 0.4

## 2018-10-29 MED ORDER — PANTOPRAZOLE SODIUM 40 MG IV SOLR
40.0000 mg | Freq: Every day | INTRAVENOUS | Status: DC
  Administered 2018-10-29 – 2018-10-31 (×3): 40 mg via INTRAVENOUS
  Filled 2018-10-29 (×3): qty 40

## 2018-10-29 MED ORDER — PAROXETINE HCL 20 MG PO TABS
20.0000 mg | ORAL_TABLET | ORAL | Status: DC
  Administered 2018-10-31 – 2018-11-01 (×2): 20 mg via ORAL
  Filled 2018-10-29 (×4): qty 1

## 2018-10-29 MED ORDER — PIPERACILLIN-TAZOBACTAM 3.375 G IVPB
3.3750 g | Freq: Three times a day (TID) | INTRAVENOUS | Status: DC
  Administered 2018-10-29 – 2018-10-31 (×7): 3.375 g via INTRAVENOUS
  Filled 2018-10-29 (×6): qty 50

## 2018-10-29 MED ORDER — ONDANSETRON 4 MG PO TBDP: 4.0000 mg | ORAL_TABLET | Freq: Four times a day (QID) | ORAL | Status: DC | PRN

## 2018-10-29 MED ORDER — ACETAMINOPHEN 325 MG PO TABS
650.0000 mg | ORAL_TABLET | Freq: Four times a day (QID) | ORAL | Status: DC | PRN
  Administered 2018-10-29 – 2018-11-01 (×3): 650 mg via ORAL
  Filled 2018-10-29 (×3): qty 2

## 2018-10-29 MED ORDER — MORPHINE SULFATE (PF) 2 MG/ML IV SOLN
2.0000 mg | INTRAVENOUS | Status: DC | PRN
  Administered 2018-10-30 (×2): 2 mg via INTRAVENOUS
  Filled 2018-10-29 (×2): qty 1

## 2018-10-29 MED ORDER — ACETAMINOPHEN 650 MG RE SUPP: 650.0000 mg | Freq: Four times a day (QID) | RECTAL | Status: DC | PRN

## 2018-10-29 MED ORDER — SODIUM CHLORIDE 0.9 % IV SOLN
INTRAVENOUS | Status: DC
  Administered 2018-10-29 – 2018-10-31 (×6): via INTRAVENOUS

## 2018-10-29 MED ORDER — ONDANSETRON HCL 4 MG/2ML IJ SOLN
4.0000 mg | Freq: Four times a day (QID) | INTRAMUSCULAR | Status: DC | PRN
  Administered 2018-10-30: 4 mg via INTRAVENOUS
  Filled 2018-10-29: qty 2

## 2018-10-29 MED ORDER — IOHEXOL 300 MG/ML  SOLN
100.0000 mL | Freq: Once | INTRAMUSCULAR | Status: AC | PRN
  Administered 2018-10-29: 100 mL via INTRAVENOUS

## 2018-10-29 MED ORDER — ALPRAZOLAM 0.5 MG PO TABS
0.5000 mg | ORAL_TABLET | Freq: Every evening | ORAL | Status: DC | PRN
  Administered 2018-10-29 – 2018-10-31 (×3): 0.5 mg via ORAL
  Filled 2018-10-29 (×3): qty 1

## 2018-10-29 NOTE — Telephone Encounter (Signed)
Spoke with Dr. Cintron Diaz at 12:50 PM to discuss admission of this patient for phlegmon. He agrees and will direct admit patient. I have called patient back and explained to her that she will need to check in at the desk in the medical mall for direct admission and NOT the ER. Patient expresses her understanding and is en route to [facility].  

## 2018-10-29 NOTE — Telephone Encounter (Signed)
I spoke with referral Coordinator Judson Roch and advised of this order.

## 2018-10-29 NOTE — Progress Notes (Signed)
   10/29/18 2000  Clinical Encounter Type  Visited With Patient  Visit Type Initial;Spiritual support;Social support;Pre-op;Other (Comment)  Referral From Nurse  Consult/Referral To Chaplain  Spiritual Encounters  Spiritual Needs Emotional;Grief support;Other (Comment)  Stress Factors  Patient Stress Factors Loss;Other (Comment)  Advance Directives (For Healthcare)  Does Patient Have a Medical Advance Directive? No  Would patient like information on creating a medical advance directive? Yes (Inpatient - patient requests chaplain consult to create a medical advance directive)

## 2018-10-29 NOTE — Progress Notes (Signed)
Pt unavailable, IS given to RN to teach.

## 2018-10-29 NOTE — Telephone Encounter (Signed)
Ordering STAT CT scan for persistent periumbilical abdominal pain and fever. Patient with history of periappendiceal abscess with rupture back in March 2020 and subsequent appendectomy.   Can we please contact referral coordinator to let them know of order? Thanks.

## 2018-10-29 NOTE — H&P (Signed)
SURGICAL HISTORY AND PHYSICAL NOTE   HISTORY OF PRESENT ILLNESS (HPI):  53 y.o. female presented to the primary care physician with abdominal pain and fever since few days ago.  She reported the pain started on the periumbilical area and then radiated to the right lower quadrant.  Patient has history of appendectomy 4 months ago.  The appendectomy was done after treatment of appendicitis with abscess.  He was initially treated with percutaneous drainage and IV antibiotics.  The surgery was done on February.  Pain does not radiate to other part of the body.  Pain is aggravated by certain body movement.  There is no alleviating factor.  Patient has had fever at home that is being controlled with Tylenol.  Denies nausea or vomiting.  Denies diarrhea or constipation.  Primary care physician ordered a CT scan of the abdomen due to the abdominal pain and she was found with a 4 cm abscess on the pericolonic area around an appendicolith.  PAST MEDICAL HISTORY (PMH):  Past Medical History:  Diagnosis Date  . Anemia 05/17/2009  . Depression   . Hyperlipidemia      PAST SURGICAL HISTORY ([place]):  Past Surgical History:  Procedure Laterality Date  . BTL  1993  . CESAREAN SECTION  [phone]  . EYE SURGERY    . HAND SURGERY Right    TRIGGER FINGER  . IR RADIOLOGIST EVAL & MGMT  05/26/2018  . IR RADIOLOGIST EVAL & MGMT  06/18/2018  . LAPAROSCOPIC APPENDECTOMY N/A 07/06/2018   Procedure: APPENDECTOMY LAPAROSCOPIC;  Surgeon: Herbert Pun, MD;  Location: [facility] ORS;  Service: General;  Laterality: N/A;  . REFRACTIVE SURGERY Bilateral 2003     MEDICATIONS:  Prior to Admission medications   Medication Sig Start Date End Date Taking? Authorizing Provider  albuterol (PROVENTIL HFA;VENTOLIN HFA) 108 (90 Base) MCG/ACT inhaler Inhale 2 puffs into the lungs every 6 (six) hours as needed for wheezing or shortness of breath. Patient not taking: Reported on 05/11/2018 05/28/17   Trinna Post, PA-C   ALPRAZolam Duanne Moron) 0.5 MG tablet TAKE 1 TABLET (0.5 MG TOTAL) BY MOUTH AT BEDTIME AS NEEDED FOR ANXIETY. 06/19/18   Mar Daring, PA-C  HYDROcodone-acetaminophen (NORCO/VICODIN) 5-325 MG tablet Take 1 tablet by mouth every 4 (four) hours as needed for moderate pain. Patient not taking: Reported on 07/06/2018 05/14/18   Herbert Pun, MD  PARoxetine (PAXIL) 20 MG tablet Take 1 tablet (20 mg total) by mouth daily. Patient taking differently: Take 20 mg by mouth every morning.  02/12/18   Birdie Sons, MD  valACYclovir (VALTREX) 1000 MG tablet TAKE 1 TABLET (1,000 MG TOTAL) BY MOUTH DAILY. AS NEEDED FOR COLD SORES. Patient taking differently: Take 1,000 mg by mouth daily as needed (cold sores).  08/21/17   Mar Daring, PA-C     ALLERGIES:  No Known Allergies   SOCIAL HISTORY:  Social History   Socioeconomic History  . Marital status: Married    Spouse name: Not on file  . Number of children: Not on file  . Years of education: Not on file  . Highest education level: Not on file  Occupational History  . Not on file  Social Needs  . Financial resource strain: Not on file  . Food insecurity    Worry: Not on file    Inability: Not on file  . Transportation needs    Medical: Not on file    Non-medical: Not on file  Tobacco Use  . Smoking status: Never  Smoker  . Smokeless tobacco: Never Used  Substance and Sexual Activity  . Alcohol use: Yes    Comment: Occasional   . Drug use: No  . Sexual activity: Not on file  Lifestyle  . Physical activity    Days per week: Not on file    Minutes per session: Not on file  . Stress: Not on file  Relationships  . Social Musicianconnections    Talks on phone: Not on file    Gets together: Not on file    Attends religious service: Not on file    Active member of club or organization: Not on file    Attends meetings of clubs or organizations: Not on file    Relationship status: Not on file  . Intimate partner violence     Fear of current or ex partner: Not on file    Emotionally abused: Not on file    Physically abused: Not on file    Forced sexual activity: Not on file  Other Topics Concern  . Not on file  Social History Narrative  . Not on file    The patient currently resides (home / rehab facility / nursing home): Home The patient normally is (ambulatory / bedbound): Ambulatory   FAMILY HISTORY:  Family History  Problem Relation Age of Onset  . Depression Father   . Cancer Mother [id number]       Breast cancer  . Breast cancer Mother [id number]     REVIEW OF SYSTEMS:  Constitutional: denies weight loss, chills, or sweats.  Positive fever Eyes: denies any other vision changes, history of eye injury  ENT: denies sore throat, hearing problems  Respiratory: denies shortness of breath, wheezing  Cardiovascular: denies chest pain, palpitations  Gastrointestinal: Positive abdominal pain, positive nausea.  Negative vomiting. Genitourinary: denies burning with urination or urinary frequency Musculoskeletal: denies any other joint pains or cramps  Skin: denies any other rashes or skin discolorations  Neurological: denies any other headache, dizziness, weakness  Psychiatric: denies any other depression, anxiety   All other review of systems were negative   VITAL SIGNS:  Temp:  [98.2 F (36.8 C)] 98.2 F (36.8 C) (07/16 1701) Pulse Rate:  [77] 77 (07/16 1701) Resp:  [18] 18 (07/16 1701) BP: (115)/(70) 115/70 (07/16 1701) SpO2:  [99 %] 99 % (07/16 1701)             INTAKE/OUTPUT:  This shift: No intake/output data recorded.  Last 2 shifts: @IOLAST2SHIFTS @   PHYSICAL EXAM:  Constitutional:  -- Normal body habitus  -- Awake, alert, and oriented x3  Eyes:  -- Pupils equally round and reactive to light  -- No scleral icterus  Ear, nose, and throat:  -- no jugular venous distension  Pulmonary:  -- No crackles  -- Equal breath sounds bilaterally -- Breathing non-labored at rest Cardiovascular:  -- S1,  S2 present  -- No pericardial rubs Gastrointestinal:  -- Abdomen soft, tender to palpation in the right lower quadrant, non-distended, no guarding or rebound tenderness -- No abdominal masses appreciated, pulsatile or otherwise  Musculoskeletal and Integumentary:  -- Wounds or skin discoloration: None appreciated -- Extremities: B/L UE and LE FROM, hands and feet warm, no edema  Neurologic:  -- Motor function: intact and symmetric -- Sensation: intact and symmetric   Labs:  CBC Latest Ref Rng & Units 10/27/2018 05/14/2018 05/12/2018  WBC 4.0 - 10.5 K/uL 8.8 8.2 12.5(H)  Hemoglobin 12.0 - 15.0 g/dL 16.112.9 0.9(U9.7(L) 10.4(L)  Hematocrit 36.0 -  46.0 % 38.9 28.5(L) 31.3(L)  Platelets 150 - 400 K/uL 280 312 241   CMP Latest Ref Rng & Units 10/27/2018 05/12/2018 05/11/2018  Glucose 70 - 99 mg/dL 101(B119(H) 510(C152(H) 585(I168(H)  BUN 6 - 20 mg/dL 12 10 8   Creatinine 0.44 - 1.00 mg/dL 7.780.48 2.420.66 3.530.64  Sodium 135 - 145 mmol/L 140 137 138  Potassium 3.5 - 5.1 mmol/L 4.0 3.6 3.8  Chloride 98 - 111 mmol/L 104 105 104  CO2 22 - 32 mmol/L 27 26 25   Calcium 8.9 - 10.3 mg/dL 9.1 6.1(W8.1(L) 4.3(X8.8(L)  Total Protein 6.5 - 8.1 g/dL 7.4 - 7.5  Total Bilirubin 0.3 - 1.2 mg/dL 0.5 - 0.8  Alkaline Phos 38 - 126 U/L 141(H) - 139(H)  AST 15 - 41 U/L 52(H) - 109(H)  ALT 0 - 44 U/L 57(H) - 90(H)     Imaging studies:  EXAM: CT ABDOMEN AND PELVIS WITH CONTRAST  TECHNIQUE: Multidetector CT imaging of the abdomen and pelvis was performed using the standard protocol following bolus administration of intravenous contrast.  CONTRAST:  100mL OMNIPAQUE IOHEXOL 300 MG/ML  SOLN  COMPARISON:  May 26, 2018  FINDINGS: Lower chest: Lung bases are clear.  Hepatobiliary: No focal liver lesions are evident. The gallbladder wall is not appreciably thickened. There is no biliary duct dilatation.  Pancreas: There is no demonstrable pancreatic mass or inflammatory focus.  Spleen: No splenic lesions are evident. There are  small splenules adjacent to the spleen.  Adrenals/Urinary Tract: Adrenals bilaterally appear normal. Kidneys bilaterally show no evident mass or hydronephrosis on either side. There is no evident renal or ureteral calculus on either side. Urinary bladder is midline with wall thickness within normal limits.  Stomach/Bowel: No demonstrable bowel obstruction. Terminal ileum appears unremarkable. There is thickening along the cecum with pericecal inflammation, likely due to recurrence of the periappendiceal inflammation. There is no free air or portal venous air. There is moderate stool in the colon.  Vascular/Lymphatic: No abdominal aortic aneurysm or evident vascular lesion. No adenopathy appreciable in the abdomen or pelvis.  Reproductive: Uterus is anteverted.  No pelvic mass.  Other: In the periappendiceal region, there is inflammation with apparent phlegmon measuring 4.5 x 4.1 cm. There is a calcification which presumably represents an appendicolith in this area measuring 1.2 x 1.2 cm. There is thickening of the wall of the adjacent cecum. There is no free air in this area. There is no air within this apparent phlegmon. There is surrounding mesenteric thickening. No similar changes elsewhere. No ascites evident in the abdomen or pelvis.  Musculoskeletal: No blastic or lytic bone lesions. No intramuscular or abdominal wall lesions are evident.  IMPRESSION: 1. Phlegmon in the periappendiceal region measuring 4.5 x 4.1 cm with a 1.2 x 1.2 cm presumed appendicolith in this area. There is thickening of the adjacent wall of the cecum. No air-fluid level in this apparent phlegmon, and no perforation evident.  2. Nearby terminal ileum appears unremarkable. No bowel obstruction.  3.  No inflammatory foci elsewhere in the abdomen or pelvis.  4. No renal or ureteral calculi. No hydronephrosis. Urinary bladder wall thickness within normal limits.  Critical Value/emergent  results were called by telephone at the time of interpretation on 10/29/2018 at 12:29 pm to [facility] POLLAK, PA, who verbally acknowledged these results.   Electronically Signed   By: Bretta BangWilliam  Woodruff III M.D.   On: 10/29/2018 12:30  Assessment/Plan:  53 y.o. female with intra-abdominal abscess, complicated by pertinent comorbidities including anxiety. Patient with  intra-abdominal abscess from a fecalith from appendectomy done 4 months ago.  Patient having fevers and abdominal pain.  I discussed with the patient the alternative of percutaneous drainage in controlling the abscess versus between surgical drainage of the abscess and looking for the appendicolith to be able to completely heal.  Patient reports that she prefers to proceed with surgical management.  I discussed with her the risk of surgery that include bowel perforation enterocutaneous fistula, bowel obstruction, further infection, bleeding, pain.  Also discussed risk of anesthesia that includes cardiac complications, pulmonary complications, DVT, among others.  Patient oriented that her case will be difficult due to the active abscess but it will be the most definite treatment.  Patient admitted to the floor and will start with IV antibiotic therapy and IV hydration.  Will prepare surgery for tomorrow.  Gae GallopEdgardo Cintrn-Daz, MD

## 2018-10-29 NOTE — Telephone Encounter (Signed)
Spoke with Dr. Windell Moment at 12:50 PM to discuss admission of this patient for phlegmon. He agrees and will direct admit patient. I have called patient back and explained to her that she will need to check in at the desk in the medical mall for direct admission and NOT the ER. Patient expresses her understanding and is en route to [facility].

## 2018-10-29 NOTE — Telephone Encounter (Signed)
I have spoken with Dr. Lowella Grip reading radiologist for this patient's abdominal CT scan. He reports there is a 4x4 cm phlegmon in the appendiceal region of the patient's prior surgery. Final read of images pending. I have spoken with patient directly and advised her to go to [facility]   I have also contacted [facility] to see if patient can be direct admitted due to her history of appendectomy, abscess and current abdominal pain and fever. Awaiting return call after doctor has been paged.

## 2018-10-30 ENCOUNTER — Inpatient Hospital Stay: Payer: BC Managed Care – PPO | Admitting: Anesthesiology

## 2018-10-30 ENCOUNTER — Encounter: Payer: Self-pay | Admitting: Anesthesiology

## 2018-10-30 ENCOUNTER — Encounter
Admission: AD | Disposition: A | Discharge status: Home / Self Care | Payer: Self-pay | Source: Ambulatory Visit | Attending: General Surgery

## 2018-10-30 HISTORY — PX: LAPAROSCOPY: SHX197

## 2018-10-30 SURGERY — LAPAROSCOPY, DIAGNOSTIC
Anesthesia: General

## 2018-10-30 MED ORDER — SUGAMMADEX SODIUM 200 MG/2ML IV SOLN
INTRAVENOUS | Status: DC | PRN
  Administered 2018-10-30: 129.8 mg via INTRAVENOUS

## 2018-10-30 MED ORDER — FENTANYL CITRATE (PF) 100 MCG/2ML IJ SOLN
INTRAMUSCULAR | Status: AC
  Filled 2018-10-30: qty 2

## 2018-10-30 MED ORDER — ONDANSETRON HCL 4 MG/2ML IJ SOLN: 4.0000 mg | Freq: Once | INTRAMUSCULAR | Status: DC | PRN

## 2018-10-30 MED ORDER — LIDOCAINE HCL (PF) 2 % IJ SOLN
INTRAMUSCULAR | Status: AC
  Filled 2018-10-30: qty 10

## 2018-10-30 MED ORDER — DEXAMETHASONE SODIUM PHOSPHATE 10 MG/ML IJ SOLN
INTRAMUSCULAR | Status: DC | PRN
  Administered 2018-10-30: 4 mg via INTRAVENOUS

## 2018-10-30 MED ORDER — FENTANYL CITRATE (PF) 100 MCG/2ML IJ SOLN
25.0000 ug | INTRAMUSCULAR | Status: DC | PRN
  Administered 2018-10-30 (×4): 25 ug via INTRAVENOUS

## 2018-10-30 MED ORDER — MIDAZOLAM HCL 2 MG/2ML IJ SOLN
INTRAMUSCULAR | Status: AC
  Filled 2018-10-30: qty 2

## 2018-10-30 MED ORDER — ROCURONIUM BROMIDE 100 MG/10ML IV SOLN
INTRAVENOUS | Status: DC | PRN
  Administered 2018-10-30: 20 mg via INTRAVENOUS
  Administered 2018-10-30: 30 mg via INTRAVENOUS
  Administered 2018-10-30: 10 mg via INTRAVENOUS

## 2018-10-30 MED ORDER — CEFAZOLIN SODIUM-DEXTROSE 2-3 GM-%(50ML) IV SOLR
INTRAVENOUS | Status: DC | PRN
  Administered 2018-10-30: 2 g via INTRAVENOUS

## 2018-10-30 MED ORDER — MIDAZOLAM HCL 2 MG/2ML IJ SOLN
INTRAMUSCULAR | Status: DC | PRN
  Administered 2018-10-30: 2 mg via INTRAVENOUS

## 2018-10-30 MED ORDER — SEVOFLURANE IN SOLN
RESPIRATORY_TRACT | Status: AC
  Filled 2018-10-30: qty 250

## 2018-10-30 MED ORDER — LIDOCAINE HCL (CARDIAC) PF 100 MG/5ML IV SOSY
PREFILLED_SYRINGE | INTRAVENOUS | Status: DC | PRN
  Administered 2018-10-30: 100 mg via INTRATRACHEAL

## 2018-10-30 MED ORDER — PROPOFOL 10 MG/ML IV BOLUS
INTRAVENOUS | Status: DC | PRN
  Administered 2018-10-30: 130 mg via INTRAVENOUS

## 2018-10-30 MED ORDER — ROCURONIUM BROMIDE 50 MG/5ML IV SOLN
INTRAVENOUS | Status: AC
  Filled 2018-10-30: qty 1

## 2018-10-30 MED ORDER — PROPOFOL 10 MG/ML IV BOLUS
INTRAVENOUS | Status: AC
  Filled 2018-10-30: qty 20

## 2018-10-30 MED ORDER — SUGAMMADEX SODIUM 200 MG/2ML IV SOLN
INTRAVENOUS | Status: AC
  Filled 2018-10-30: qty 2

## 2018-10-30 MED ORDER — MORPHINE SULFATE (PF) 4 MG/ML IV SOLN
4.0000 mg | INTRAVENOUS | Status: DC | PRN
  Administered 2018-10-30 (×2): 4 mg via INTRAVENOUS
  Filled 2018-10-30 (×3): qty 1

## 2018-10-30 MED ORDER — FENTANYL CITRATE (PF) 100 MCG/2ML IJ SOLN
INTRAMUSCULAR | Status: DC | PRN
  Administered 2018-10-30 (×2): 50 ug via INTRAVENOUS

## 2018-10-30 MED ORDER — BUPIVACAINE-EPINEPHRINE (PF) 0.5% -1:200000 IJ SOLN
INTRAMUSCULAR | Status: AC
  Filled 2018-10-30: qty 30

## 2018-10-30 MED ORDER — FENTANYL CITRATE (PF) 100 MCG/2ML IJ SOLN
INTRAMUSCULAR | Status: AC
  Administered 2018-10-30: 25 ug via INTRAVENOUS
  Filled 2018-10-30: qty 2

## 2018-10-30 MED ORDER — BUPIVACAINE-EPINEPHRINE 0.5% -1:200000 IJ SOLN
INTRAMUSCULAR | Status: DC | PRN
  Administered 2018-10-30: 15 mL

## 2018-10-30 MED ORDER — KETOROLAC TROMETHAMINE 30 MG/ML IJ SOLN
30.0000 mg | Freq: Four times a day (QID) | INTRAMUSCULAR | Status: DC
  Administered 2018-10-30 – 2018-11-01 (×7): 30 mg via INTRAVENOUS
  Filled 2018-10-30 (×8): qty 1

## 2018-10-30 SURGICAL SUPPLY — 54 items
APPLIER CLIP LOGIC TI 5 (MISCELLANEOUS) IMPLANT
BLADE SURG SZ11 CARB STEEL (BLADE) ×2 IMPLANT
BULB RESERV EVAC DRAIN JP 100C (MISCELLANEOUS) ×1 IMPLANT
CANISTER SUCT 1200ML W/VALVE (MISCELLANEOUS) ×2 IMPLANT
CHLORAPREP W/TINT 26 (MISCELLANEOUS) ×2 IMPLANT
COVER WAND RF STERILE (DRAPES) ×2 IMPLANT
CUTTER FLEX LINEAR 45M (STAPLE) ×1 IMPLANT
DERMABOND ADVANCED (GAUZE/BANDAGES/DRESSINGS) ×1
DERMABOND ADVANCED .7 DNX12 (GAUZE/BANDAGES/DRESSINGS) ×1 IMPLANT
DRAIN CHANNEL JP 15F RND 16 (MISCELLANEOUS) ×1 IMPLANT
DRSG TEGADERM 4X4.75 (GAUZE/BANDAGES/DRESSINGS) ×2 IMPLANT
ELECT CAUTERY BLADE TIP 2.5 (TIP) ×2
ELECT REM PT RETURN 9FT ADLT (ELECTROSURGICAL) ×2
ELECTRODE CAUTERY BLDE TIP 2.5 (TIP) IMPLANT
ELECTRODE REM PT RTRN 9FT ADLT (ELECTROSURGICAL) ×1 IMPLANT
GLOVE BIO SURGEON STRL SZ 6.5 (GLOVE) ×2 IMPLANT
GLOVE INDICATOR 6.5 STRL GRN (GLOVE) ×2 IMPLANT
GOWN STRL REUS W/ TWL LRG LVL3 (GOWN DISPOSABLE) ×3 IMPLANT
GOWN STRL REUS W/TWL LRG LVL3 (GOWN DISPOSABLE) ×4
GRASPER SUT TROCAR 14GX15 (MISCELLANEOUS) ×2 IMPLANT
HANDLE YANKAUER SUCT BULB TIP (MISCELLANEOUS) ×2 IMPLANT
IRRIGATION STRYKERFLOW (MISCELLANEOUS) IMPLANT
IRRIGATOR STRYKERFLOW (MISCELLANEOUS)
IV NS 1000ML (IV SOLUTION) ×1
IV NS 1000ML BAXH (IV SOLUTION) ×1 IMPLANT
KIT TURNOVER KIT A (KITS) ×2 IMPLANT
L-HOOK LAP DISP 36CM (ELECTROSURGICAL) ×2
LHOOK LAP DISP 36CM (ELECTROSURGICAL) IMPLANT
LIGASURE LAP MARYLAND 5MM 37CM (ELECTROSURGICAL) ×2 IMPLANT
NEEDLE HYPO 22GX1.5 SAFETY (NEEDLE) ×2 IMPLANT
NEEDLE VERESS 14GA 120MM (NEEDLE) ×2 IMPLANT
NS IRRIG 500ML POUR BTL (IV SOLUTION) ×2 IMPLANT
PACK LAP CHOLECYSTECTOMY (MISCELLANEOUS) ×2 IMPLANT
PENCIL ELECTRO HAND CTR (MISCELLANEOUS) ×1 IMPLANT
POUCH ENDO CATCH 10MM SPEC (MISCELLANEOUS) ×2 IMPLANT
RELOAD 45 VASCULAR/THIN (ENDOMECHANICALS) IMPLANT
RELOAD STAPLE 45 2.5 WHT GRN (ENDOMECHANICALS) IMPLANT
RELOAD STAPLE 45 3.5 BLU ETS (ENDOMECHANICALS) ×1 IMPLANT
RELOAD STAPLE TA45 3.5 REG BLU (ENDOMECHANICALS) IMPLANT
SCISSORS METZENBAUM CVD 33 (INSTRUMENTS) ×1 IMPLANT
SET TUBE SMOKE EVAC HIGH FLOW (TUBING) ×2 IMPLANT
SLEEVE ENDOPATH XCEL 5M (ENDOMECHANICALS) ×2 IMPLANT
SPONGE GAUZE 2X2 8PLY STRL LF (GAUZE/BANDAGES/DRESSINGS) ×3 IMPLANT
SPONGE LAP 18X18 RF (DISPOSABLE) ×1 IMPLANT
SUT ETHILON 3-0 FS-10 30 BLK (SUTURE) ×2
SUT MNCRL AB 4-0 PS2 18 (SUTURE) ×2 IMPLANT
SUT PDS AB 0 CT1 27 (SUTURE) ×2 IMPLANT
SUT VICRYL PLUS ABS 0 54 (SUTURE) ×2 IMPLANT
SUTURE EHLN 3-0 FS-10 30 BLK (SUTURE) IMPLANT
SYS LAPSCP GELPORT 120MM (MISCELLANEOUS) ×2
SYSTEM LAPSCP GELPORT 120MM (MISCELLANEOUS) IMPLANT
TRAY FOLEY MTR SLVR 16FR STAT (SET/KITS/TRAYS/PACK) ×2 IMPLANT
TROCAR XCEL 12X100 BLDLESS (ENDOMECHANICALS) ×2 IMPLANT
TROCAR XCEL NON-BLD 5MMX100MML (ENDOMECHANICALS) ×2 IMPLANT

## 2018-10-30 NOTE — Op Note (Signed)
Preoperative diagnosis: Intra abdominal abscess  Postoperative diagnosis: Intra abdominal mesenteric abscess  Procedure: Laparoscopic drainage of intra abdominal abscess  Anesthesia: GETA  Surgeon: Dr. Windell Moment, MD  Wound Classification: Contaminated  Indications: Patient is a 52 y.o. female  presented with periumbilical pain that radiated to the right lower quadrant pain and fever episodes.  CT scan was done showing an intra-abdominal abscess in the mesentery of the ascending colon.  Appendicolith was found as a source of the abscess.  Findings: 1.  Abscess on the medial aspect of the ascending colon mesentery. 2.  Appendicolith was able to be found and removed 3.  No spillage of pus. 4.  Healthy large and small intestine. 5. Adequate hemostasis.   Description of procedure: The patient was placed on the operating table in the supine position. General anesthesia was induced. A time-out was completed verifying correct patient, procedure, site, positioning, and implant(s) and/or special equipment prior to beginning this procedure. A Foley catheter and orogastric tubes were placed. The abdomen was prepped and draped in the usual sterile fashion.  An incision was made above the umbilicus.  Dissection was carried down to the fascia.  The fascia was opened and a Hassan technique.  12 mm Hassan trocar was inserted. The abdomen was insufflated with carbon dioxide to a pressure of 15 mmHg. The patient tolerated insufflation well. The laparoscope was inserted and the abdomen inspected. No injuries from initial trocar placement were noted. Under direct visualization, an 5-mm trocar was inserted in the left lower quadrant lateral to the rectus muscle. A 5-mm port was then placed above the symphysis pubis on midline.  Care was taken to avoid injury to the bladder or inferior epigastric vessels. The table was placed in the Trendelenburg position with the right side elevated. The ascending large  intestine was elevated.  The abscess cavity was found on the medial aspect of the mesentery.  Careful dissection was done and as the cavity was entered.  Drainage of pus was done and suctioned.  The cavity was opened a little more and the appendectomy was identified but unable to be grasped with any of the instrument. The supraumbilical incision was enlarged up to 4 cm.  Dissection was carried down to the fascia and the fascia was opened.  GelPort was introduced.  With a hand-assisted technique the lateral aspect of the colon was mobilized.  I was able to grasp better the ascending colon.  The abscess cavity was opened LA to be more and finally the umbilical he was able to be removed.  Multiple septae of the abscess was opened and suctioned.  A drain was left in place and removed through the left lower quadrant incision. Fluid was suctioned and no other pathology was identified. Secondary trocars were removed under direct vision. No bleeding was noted.  The midline fascia was closed with PDS 0.  The skin was closed with subcuticular sutures Monocryl 3-0 and Dermabond was applied.  The patient tolerated the procedure well and was taken to the postanesthesia care unit in satisfactory condition.   Specimen: Appendicolith  Complications: None  Estimated Blood Loss: 10 mL

## 2018-10-30 NOTE — Progress Notes (Signed)
   10/30/18 1300  Clinical Encounter Type  Visited With Patient not available  Chaplain visit the patient and she was gone to surgery. Chaplain will need to follow up to see if patient would like to complete AD.

## 2018-10-30 NOTE — Anesthesia Post-op Follow-up Note (Signed)
Anesthesia QCDR form completed.        

## 2018-10-30 NOTE — Transfer of Care (Signed)
Immediate Anesthesia Transfer of Care Note  Patient: Pamela Taylor  Procedure(s) Performed: Laparoscopic drainage of intraabdominal abscess (N/A )  Patient Location: PACU  Anesthesia Type:General  Level of Consciousness: awake, alert  and oriented  Airway & Oxygen Therapy: Patient Spontanous Breathing and Patient connected to face mask oxygen  Post-op Assessment: Report given to RN and Post -op Vital signs reviewed and stable  Post vital signs: Reviewed and stable  Last Vitals:  Vitals Value Taken Time  BP 107/49 10/30/18 1326  Temp    Pulse 92 10/30/18 1327  Resp 0 10/30/18 1327  SpO2 100 % 10/30/18 1327  Vitals shown include unvalidated device data.  Last Pain:  Vitals:   10/30/18 1050  TempSrc: Tympanic  PainSc: 4          Complications: No apparent anesthesia complications

## 2018-10-30 NOTE — Anesthesia Preprocedure Evaluation (Signed)
Anesthesia Evaluation  Patient identified by MRN, date of birth, ID band Patient awake    Reviewed: Allergy & Precautions, NPO status , Patient's Chart, lab work & pertinent test results, reviewed documented beta blocker date and time   Airway Mallampati: II  TM Distance: >3 FB     Dental  (+) Chipped   Pulmonary           Cardiovascular      Neuro/Psych PSYCHIATRIC DISORDERS Anxiety Depression    GI/Hepatic   Endo/Other    Renal/GU      Musculoskeletal   Abdominal   Peds  Hematology  (+) anemia ,   Anesthesia Other Findings   Reproductive/Obstetrics                             Anesthesia Physical Anesthesia Plan  ASA: II  Anesthesia Plan: General   Post-op Pain Management:    Induction: Intravenous  PONV Risk Score and Plan:   Airway Management Planned: Oral ETT  Additional Equipment:   Intra-op Plan:   Post-operative Plan:   Informed Consent: I have reviewed the patients History and Physical, chart, labs and discussed the procedure including the risks, benefits and alternatives for the proposed anesthesia with the patient or authorized representative who has indicated his/her understanding and acceptance.       Plan Discussed with: CRNA  Anesthesia Plan Comments:         Anesthesia Quick Evaluation

## 2018-10-30 NOTE — Anesthesia Postprocedure Evaluation (Signed)
Anesthesia Post Note  Patient: Pamela Taylor  Procedure(s) Performed: Laparoscopic drainage of intraabdominal abscess (N/A )  Patient location during evaluation: PACU Anesthesia Type: General Level of consciousness: awake and alert Pain management: pain level controlled Vital Signs Assessment: post-procedure vital signs reviewed and stable Respiratory status: spontaneous breathing, nonlabored ventilation, respiratory function stable and patient connected to nasal cannula oxygen Cardiovascular status: blood pressure returned to baseline and stable Postop Assessment: no apparent nausea or vomiting Anesthetic complications: no     Last Vitals:  Vitals:   10/30/18 1415 10/30/18 1439  BP: 110/67 114/68  Pulse: 85 82  Resp: 17 18  Temp:  36.7 C  SpO2: 98% 97%    Last Pain:  Vitals:   10/30/18 1500  TempSrc:   PainSc: [place]

## 2018-10-30 NOTE — Anesthesia Procedure Notes (Signed)
Procedure Name: Intubation Date/Time: 10/30/2018 11:20 AM Performed by: Babs Sciara, CRNA Pre-anesthesia Checklist: Patient identified, Emergency Drugs available, Suction available, Patient being monitored and Timeout performed Patient Re-evaluated:Patient Re-evaluated prior to induction Oxygen Delivery Method: Circle system utilized Preoxygenation: Pre-oxygenation with 100% oxygen Induction Type: IV induction Ventilation: Mask ventilation without difficulty Laryngoscope Size: McGraph and 3 Tube size: 7.5 mm Number of attempts: 1 Airway Equipment and Method: Stylet and Video-laryngoscopy Placement Confirmation: positive ETCO2 and breath sounds checked- equal and bilateral Secured at: 21 (lip) cm Tube secured with: Tape Dental Injury: Teeth and Oropharynx as per pre-operative assessment  Comments: Slightly stiff neck, with large front teeth and floppy epiglottis, DL with 3 mac no view, switched to Mcgrath--easy.

## 2018-10-30 NOTE — Interval H&P Note (Signed)
History and Physical Interval Note:  10/30/2018 10:51 AM  Pamela Taylor  has presented today for surgery, with the diagnosis of IntraAbdominal abscess.  The various methods of treatment have been discussed with the patient and family. After consideration of risks, benefits and other options for treatment, the patient has consented to  Procedure(s): Laparoscopic drainage of intraabdominal abscess (N/A) vs open drainage as a surgical intervention.  The patient's history has been reviewed, patient examined, no change in status, stable for surgery.  I have reviewed the patient's chart and labs.  Questions were answered to the patient's satisfaction.     Herbert Pun

## 2018-10-31 ENCOUNTER — Encounter: Payer: Self-pay | Admitting: General Surgery

## 2018-10-31 NOTE — Progress Notes (Signed)
[facility] Day(s): 2.   Post op day(s): 1 Day Post-Op.   Interval History: Patient seen and examined, no acute events or new complaints overnight. Patient reports having some improvement of the abdominal pain but still significantly sore.  She denies any nausea or vomiting.  She denies any fever or chills.  Vital signs in last 24 hours: [min-max] current  Temp:  [97.7 F (36.5 C)-100.3 F (37.9 C)] 97.7 F (36.5 C) (07/18 0532) Pulse Rate:  [67-91] 67 (07/18 0532) Resp:  [17-28] 17 (07/18 0532) BP: (96-119)/(49-69) 96/51 (07/18 0532) SpO2:  [92 %-100 %] 97 % (07/18 0532) Weight:  [64.9 kg] 64.9 kg (07/17 1050)     Height: 5\' 2"  (157.5 cm) Weight: 64.9 kg BMI (Calculated): 26.17   Drain:30 mL since surgery (serosanguineous)  Physical Exam:  Constitutional: alert, cooperative and no distress  Respiratory: breathing non-labored at rest  Cardiovascular: regular rate and sinus rhythm  Gastrointestinal: soft, moderate-tender, and non-distended  Labs:  CBC Latest Ref Rng & Units 10/27/2018 05/14/2018 05/12/2018  WBC 4.0 - 10.5 K/uL 8.8 8.2 12.5(H)  Hemoglobin 12.0 - 15.0 g/dL 12.9 9.7(L) 10.4(L)  Hematocrit 36.0 - 46.0 % 38.9 28.5(L) 31.3(L)  Platelets 150 - 400 K/uL 280 312 241   CMP Latest Ref Rng & Units 10/27/2018 05/12/2018 05/11/2018  Glucose 70 - 99 mg/dL 119(H) 152(H) 168(H)  BUN 6 - 20 mg/dL 12 10 8   Creatinine 0.44 - 1.00 mg/dL 0.48 0.66 0.64  Sodium 135 - 145 mmol/L 140 137 138  Potassium 3.5 - 5.1 mmol/L 4.0 3.6 3.8  Chloride 98 - 111 mmol/L 104 105 104  CO2 22 - 32 mmol/L 27 26 25   Calcium 8.9 - 10.3 mg/dL 9.1 8.1(L) 8.8(L)  Total Protein 6.5 - 8.1 g/dL 7.4 - 7.5  Total Bilirubin 0.3 - 1.2 mg/dL 0.5 - 0.8  Alkaline Phos 38 - 126 U/L 141(H) - 139(H)  AST 15 - 41 U/L 52(H) - 109(H)  ALT 0 - 44 U/L 57(H) - 90(H)    Imaging studies: No new pertinent imaging studies   Assessment/Plan:  53 y.o. female with intra-abdominal abscess s/p surgical  intra-abdominal drainage postop day #1, complicated by pertinent comorbidities including anxiety. Patient today recovering properly.  Continue with significant pain.  Tylenol is helping to control the pain.  There has been no fever since surgery.  Would like to see at least this 24 hours without any other episode of fever before changing IV antibiotics to oral antibiotic.  Patient encouraged to ambulate.  We will continue with pain management.  We will continue with DVT prophylaxis.  Arnold Long, MD

## 2018-10-31 NOTE — Plan of Care (Signed)

## 2018-10-31 NOTE — Progress Notes (Addendum)
Shift summary:  - IV infiltrated this AM, unable to reestablish access. IV team consult placed for restart.  - Ambulated three separate laps around the unit today unassisted.  - Passing flatus; no BM today. Voiding without difficulty.

## 2018-11-01 LAB — NOVEL CORONAVIRUS, NAA: SARS-CoV-2, NAA: NOT DETECTED

## 2018-11-01 MED ORDER — HYDROCODONE-ACETAMINOPHEN 5-325 MG PO TABS: 1.0000 | ORAL_TABLET | ORAL | 0 refills | Status: AC | PRN

## 2018-11-01 MED ORDER — AMOXICILLIN-POT CLAVULANATE 875-125 MG PO TABS: 1.0000 | ORAL_TABLET | Freq: Two times a day (BID) | ORAL | 0 refills | Status: AC

## 2018-11-01 NOTE — Discharge Summary (Signed)
  Patient ID: Pamela Taylor MRN: [id number] DOB/AGE: 1966/01/28 53 y.o.  Admit date: 10/29/2018 Discharge date: 11/01/2018   Discharge Diagnoses:  Active Problems:   Intra-abdominal abscess ([place])   Procedures: Intra-abdominal abscess surgical drainage  Hospital Course: Patient with intra-abdominal abscess due to retained fecalith.  She underwent surgical drainage of the abscess.  She tolerated procedure well she has been for 8 hours without fever.  Pain controlled.  Drain removed today.  Tolerating diet.  Having bowel movement.  Physical Exam  Constitutional: She is oriented to person, place, and time and well-developed, well-nourished, and in no distress.  Neck: Normal range of motion.  Cardiovascular: Normal rate.  Pulmonary/Chest: Effort normal.  Abdominal: Soft. Bowel sounds are normal. She exhibits no distension. There is no abdominal tenderness. There is no rebound.  Neurological: She is alert and oriented to person, place, and time.  Skin: Skin is warm.   Consults: None  Disposition: Discharge disposition: 01-Home or Self Care       Discharge Instructions    Diet - low sodium heart healthy   Complete by: As directed      Allergies as of 11/01/2018   No Known Allergies     Medication List    STOP taking these medications   albuterol 108 (90 Base) MCG/ACT inhaler Commonly known as: VENTOLIN HFA   ALPRAZolam 0.5 MG tablet Commonly known as: XANAX   PARoxetine 20 MG tablet Commonly known as: PAXIL   valACYclovir 1000 MG tablet Commonly known as: VALTREX     TAKE these medications   amoxicillin-clavulanate 875-125 MG tablet Commonly known as: Augmentin Take 1 tablet by mouth 2 (two) times daily for 10 days.   HYDROcodone-acetaminophen 5-325 MG tablet Commonly known as: Norco Take 1 tablet by mouth every 4 (four) hours as needed for up to 4 days for moderate pain.      Follow-up Information    Herbert Pun, MD Follow up in 2 week(s).    Specialty: General Surgery Contact information: [address] [place] [address] [phone]

## 2018-11-01 NOTE — Progress Notes (Signed)
The patient has been discharged. IV removed. Wallet and rings returned from security.

## 2018-11-01 NOTE — Discharge Instructions (Signed)

## 2018-11-03 LAB — SURGICAL PATHOLOGY

## 2018-11-05 ENCOUNTER — Encounter (INDEPENDENT_AMBULATORY_CARE_PROVIDER_SITE_OTHER): Payer: BC Managed Care – PPO | Admitting: Physician Assistant

## 2018-11-05 DIAGNOSIS — F419 Anxiety disorder, unspecified: Secondary | ICD-10-CM

## 2018-11-05 MED ORDER — ALPRAZOLAM 0.5 MG PO TABS: 0.5000 mg | ORAL_TABLET | Freq: Every day | ORAL | 0 refills | Status: DC | PRN

## 2018-11-05 NOTE — Telephone Encounter (Signed)
Please see MyChart Encounter. I have assessed the need of this medication, which we are using in the short term for post-operative anxiety. I have discussed the risks and benefits of this medication which include over sedation, respiratory depression, dependence and addiction. This medication should not be mixed with pain medications like hydrocodone or alcohol, and you should not drive with this medication. I have spent 15 minutes with this patient, >50% of which was spent on counseling and coordination of care.

## 2018-11-12 ENCOUNTER — Other Ambulatory Visit: Payer: Self-pay | Admitting: General Surgery

## 2018-11-12 DIAGNOSIS — K651 Peritoneal abscess: Secondary | ICD-10-CM

## 2018-12-23 ENCOUNTER — Encounter: Payer: Self-pay | Admitting: Physician Assistant

## 2018-12-23 DIAGNOSIS — Z1239 Encounter for other screening for malignant neoplasm of breast: Secondary | ICD-10-CM

## 2019-01-26 ENCOUNTER — Ambulatory Visit
Admission: RE | Admit: 2019-01-26 | Discharge: 2019-01-26 | Disposition: A | Discharge status: Home / Self Care | Payer: BC Managed Care – PPO | Source: Ambulatory Visit | Attending: Physician Assistant | Admitting: Physician Assistant

## 2019-01-26 DIAGNOSIS — Z1231 Encounter for screening mammogram for malignant neoplasm of breast: Secondary | ICD-10-CM | POA: Insufficient documentation

## 2019-01-26 DIAGNOSIS — Z1239 Encounter for other screening for malignant neoplasm of breast: Secondary | ICD-10-CM | POA: Diagnosis present

## 2019-01-27 ENCOUNTER — Encounter: Payer: Self-pay | Admitting: Family Medicine

## 2019-01-27 ENCOUNTER — Ambulatory Visit (INDEPENDENT_AMBULATORY_CARE_PROVIDER_SITE_OTHER): Payer: BC Managed Care – PPO | Admitting: Family Medicine

## 2019-01-27 ENCOUNTER — Other Ambulatory Visit: Payer: Self-pay

## 2019-01-27 VITALS — BP 111/75 | HR 80 | Temp 97.1°F | Resp 16 | Ht 62.0 in | Wt 146.0 lb

## 2019-01-27 DIAGNOSIS — F419 Anxiety disorder, unspecified: Secondary | ICD-10-CM

## 2019-01-27 DIAGNOSIS — Z23 Encounter for immunization: Secondary | ICD-10-CM

## 2019-01-27 DIAGNOSIS — Z0000 Encounter for general adult medical examination without abnormal findings: Secondary | ICD-10-CM | POA: Diagnosis not present

## 2019-01-27 DIAGNOSIS — E7800 Pure hypercholesterolemia, unspecified: Secondary | ICD-10-CM | POA: Diagnosis not present

## 2019-01-27 MED ORDER — ALPRAZOLAM 0.5 MG PO TABS: 0.5000 mg | ORAL_TABLET | Freq: Every day | ORAL | 1 refills | Status: DC | PRN

## 2019-01-27 NOTE — Assessment & Plan Note (Signed)
Not currently on a statin Recheck lipid panel and CMP 

## 2019-01-27 NOTE — Patient Instructions (Signed)

## 2019-01-27 NOTE — Assessment & Plan Note (Signed)
Exacerbated in the setting of COVID-19 pandemic and recent surgeries Patient is taking Xanax as needed Refill given today

## 2019-01-27 NOTE — Progress Notes (Signed)
Patient: Pamela Taylor, Female    DOB: 01-05-1966, 53 y.o.   MRN: [id number] Visit Date: 01/27/2019  Today's Provider: Lavon Paganini, MD   Chief Complaint  Patient presents with  . Annual Exam   Subjective:     Annual physical exam [name] is a 53 y.o. female who presents today for health maintenance and complete physical. She feels well. She reports exercising daily. She reports she is sleeping fairly well. 11/18/2017 CPE 11/18/2017 Pap/HPV-negative 01/26/2019 Mammogram -not read yet  -----------------------------------------------------------------   Review of Systems  Constitutional: Negative.   HENT: Negative.   Eyes: Negative.   Respiratory: Negative.   Cardiovascular: Negative.   Gastrointestinal: Negative.   Endocrine: Negative.   Genitourinary: Negative.   Musculoskeletal: Negative.   Skin: Negative.   Allergic/Immunologic: Negative.   Neurological: Negative.   Hematological: Negative.   Psychiatric/Behavioral: Negative.     Social History      She  reports that she has never smoked. She has never used smokeless tobacco. She reports current alcohol use. She reports that she does not use drugs.       Social History   Socioeconomic History  . Marital status: Married    Spouse name: Not on file  . Number of children: Not on file  . Years of education: Not on file  . Highest education level: Not on file  Occupational History  . Not on file  Social Needs  . Financial resource strain: Not on file  . Food insecurity    Worry: Not on file    Inability: Not on file  . Transportation needs    Medical: Not on file    Non-medical: Not on file  Tobacco Use  . Smoking status: Never Smoker  . Smokeless tobacco: Never Used  Substance and Sexual Activity  . Alcohol use: Yes    Comment: Occasional   . Drug use: No  . Sexual activity: Not on file  Lifestyle  . Physical activity    Days per week: Not on file    Minutes per session:  Not on file  . Stress: Not on file  Relationships  . Social Herbalist on phone: Not on file    Gets together: Not on file    Attends religious service: Not on file    Active member of club or organization: Not on file    Attends meetings of clubs or organizations: Not on file    Relationship status: Not on file  Other Topics Concern  . Not on file  Social History Narrative  . Not on file    Past Medical History:  Diagnosis Date  . Anemia 05/17/2009  . Depression   . Hyperlipidemia      Patient Active Problem List   Diagnosis Date Noted  . Intra-abdominal abscess ([place]) 10/29/2018  . Chronic appendicitis 07/06/2018  . Acute appendicitis with peritoneal abscess 05/11/2018  . Difficulty hearing 09/05/2015  . Pain in soft tissues of limb 09/05/2015  . 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 Acute anxiety 12/21/2014  . Anemia, iron deficiency 05/17/2009  . Hypercholesterolemia without hypertriglyceridemia 05/13/2009  . Clinical depression 04/27/2009  . Dermatologic disease 04/27/2009  . Herpes 04/27/2009    Past Surgical History:  Procedure Laterality Date  . BTL  1993  . CESAREAN SECTION  [phone]  . EYE SURGERY    . HAND SURGERY Right    TRIGGER FINGER  . IR  RADIOLOGIST EVAL & MGMT  05/26/2018  . IR RADIOLOGIST EVAL & MGMT  06/18/2018  . LAPAROSCOPIC APPENDECTOMY N/A 07/06/2018   Procedure: APPENDECTOMY LAPAROSCOPIC;  Surgeon: Carolan Shiver, MD;  Location: [facility] ORS;  Service: General;  Laterality: N/A;  . LAPAROSCOPY N/A 10/30/2018   Procedure: Laparoscopic drainage of intraabdominal abscess;  Surgeon: Carolan Shiver, MD;  Location: [facility] ORS;  Service: General;  Laterality: N/A;  . REFRACTIVE SURGERY Bilateral 2003    Family History        Family Status  Relation Name Status  . Father  (Not Specified)       substance abuse, alcoholism in revocery. Early Dementia, CHF. No previous AMI; emphysema   . Sister  Alive  . Brother  Alive  . Mother  (Not Specified)        Her family history includes Breast cancer (age of onset: 5) in her mother; Cancer (age of onset: 71) in her mother; Depression in her father.      No Known Allergies   Current Outpatient Medications:  .  ALPRAZolam (XANAX) 0.5 MG tablet, Take 1 tablet (0.5 mg total) by mouth daily as needed for anxiety., Disp: 20 tablet, Rfl: 0 .  PARoxetine (PAXIL) 20 MG tablet, Take 20 mg by mouth daily., Disp: , Rfl:    Patient Care Team: Erasmo Downer, MD as PCP - General (Family Medicine)    Objective:    Vitals: BP 111/75 (BP Location: Left Arm, Patient Position: Sitting, Cuff Size: Normal)   Pulse 80   Temp (!) 97.1 F (36.2 C) (Temporal)   Resp 16   Ht 5\' 2"  (1.575 m)   Wt 146 lb (66.2 kg)   LMP 11/14/2015   BMI 26.70 kg/m    Vitals:   01/27/19 1407  BP: 111/75  Pulse: 80  Resp: 16  Temp: (!) 97.1 F (36.2 C)  TempSrc: Temporal  Weight: 146 lb (66.2 kg)  Height: 5\' 2"  (1.575 m)     Physical Exam Vitals signs reviewed.  Constitutional:      General: She is not in acute distress.    Appearance: Normal appearance. She is well-developed. She is not diaphoretic.  HENT:     Head: Normocephalic and atraumatic.     Right Ear: Tympanic membrane, ear canal and external ear normal.     Left Ear: Tympanic membrane, ear canal and external ear normal.  Eyes:     General: No scleral icterus.    Conjunctiva/sclera: Conjunctivae normal.     Pupils: Pupils are equal, round, and reactive to light.  Neck:     Musculoskeletal: Neck supple.     Thyroid: No thyromegaly.  Cardiovascular:     Rate and Rhythm: Normal rate and regular rhythm.     Pulses: Normal pulses.     Heart sounds: Normal heart sounds. No murmur.  Pulmonary:     Effort: Pulmonary effort is normal. No respiratory distress.     Breath sounds: Normal breath sounds. No wheezing or rales.  Abdominal:     General: There is no distension.      Palpations: Abdomen is soft.     Tenderness: There is no abdominal tenderness.  Musculoskeletal:        General: No deformity.     Right lower leg: No edema.     Left lower leg: No edema.  Lymphadenopathy:     Cervical: No cervical adenopathy.  Skin:    General: Skin is warm and dry.     Capillary  Refill: Capillary refill takes less than 2 seconds.     Findings: No rash.  Neurological:     Mental Status: She is alert and oriented to person, place, and time. Mental status is at baseline.  Psychiatric:        Mood and Affect: Mood normal.        Behavior: Behavior normal.        Thought Content: Thought content normal.      Depression Screen PHQ 2/9 Scores 01/27/2019 09/06/2015  PHQ - 2 Score 1 0  PHQ- 9 Score 2 -       Assessment & Plan:     Routine Health Maintenance and Physical Exam  Exercise Activities and Dietary recommendations Goals   None     Immunization History  Administered Date(s) Administered  . Influenza Split 04/27/2009  . Influenza,inj,Quad PF,6+ Mos 01/06/2014, 01/05/2016, 02/02/2018  . Influenza-Unspecified 02/13/2017  . Tdap 11/18/2017  . Zoster Recombinat (Shingrix) 12/11/2016    Health Maintenance  Topic Date Due  . MAMMOGRAM  12/19/2017  . PAP SMEAR-Modifier  11/18/2020  . COLONOSCOPY  09/14/2023  . TETANUS/TDAP  11/19/2027  . INFLUENZA VACCINE  Completed  . Hepatitis C Screening  Completed  . HIV Screening  Completed     Discussed health benefits of physical activity, and encouraged her to engage in regular exercise appropriate for her age and condition.    --------------------------------------------------------------------  Problem List Items Addressed This Visit      Other   Acute anxiety    Exacerbated in the setting of COVID-19 pandemic and recent surgeries Patient is taking Xanax as needed Refill given today      Relevant Medications   PARoxetine (PAXIL) 20 MG tablet   ALPRAZolam (XANAX) 0.5 MG tablet    Hypercholesterolemia without hypertriglyceridemia    Not currently on a statin Recheck lipid panel and CMP      Relevant Orders   Lipid Panel With LDL/HDL Ratio   TSH    Other Visit Diagnoses    Annual physical exam    -  Primary   Relevant Orders   Comprehensive metabolic panel   Need for influenza vaccination       Relevant Orders   Flu Vaccine QUAD 36+ mos IM (Completed)       Return in about 1 year (around 01/27/2020) for CPE.   The entirety of the information documented in the History of Present Illness, Review of Systems and Physical Exam were personally obtained by me. Portions of this information were initially documented by Rondel BatonSulibeya Dimas, CMA and reviewed by me for thoroughness and accuracy.    Crystle Carelli, Marzella SchleinAngela M, MD MPH [facility]

## 2019-02-01 IMAGING — CT CT CORE BIOPSY RENAL
1 of 3 series · 11 of 32 positions shown, 17 images · non-contrast
Comparison: CT of the abdomen and pelvis with contrast earlier on
05/11/2018

INDICATION: Acute ruptured appendicitis with small periappendiceal abscess
medial to a calcified appendicolith by CT.

EXAM:
CT-GUIDED DRAINAGE OF APPENDICEAL ABSCESS BY PERCUTANEOUS CATHETER

[Series 2: i-spiral 5.0 b30f · axial · 0.64mm/px · z∈[-206,-112]mm · 11 of 33 slices shown, 17 images]
[im 3/33  soft-tissue]
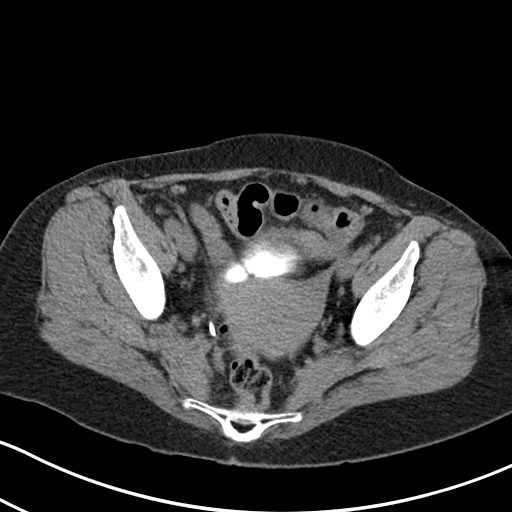
[im 3/33  bone]
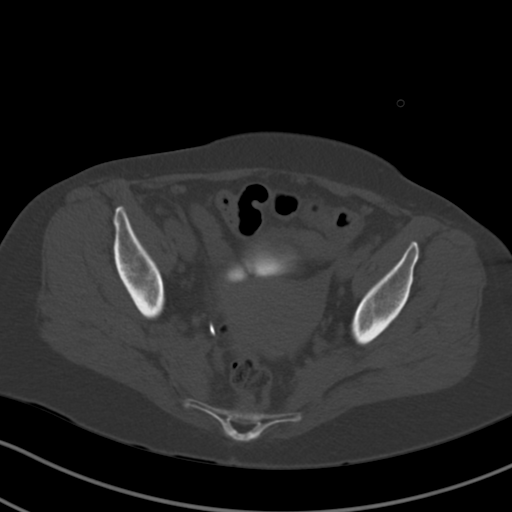
[im 6/33  soft-tissue]
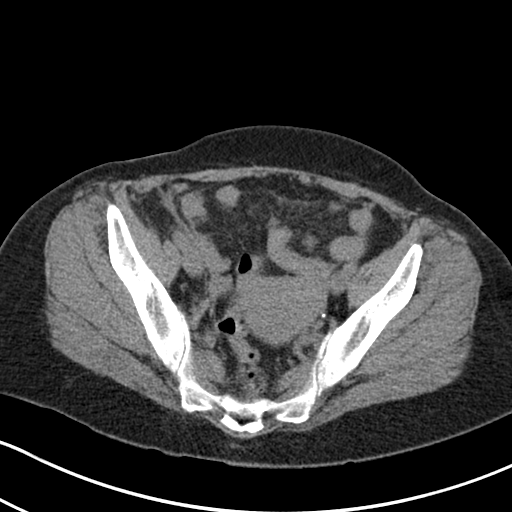
[im 9/33  soft-tissue]
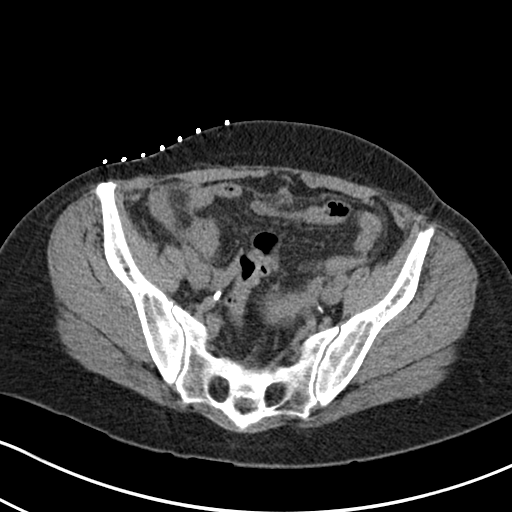
[im 11/33  soft-tissue]
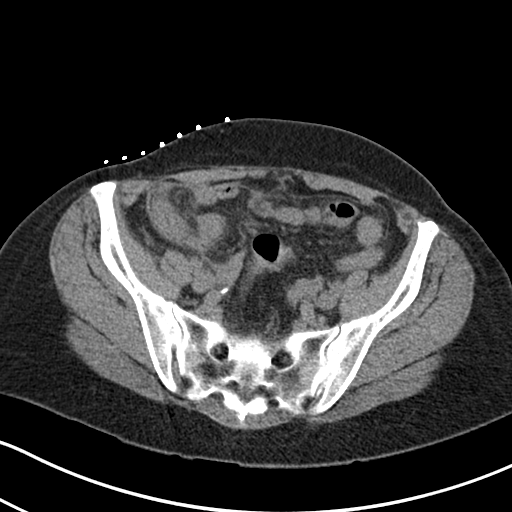
[im 14/33  soft-tissue]
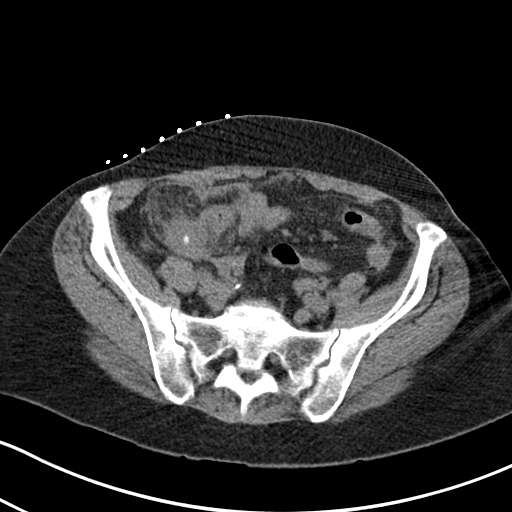
[im 17/33  soft-tissue]
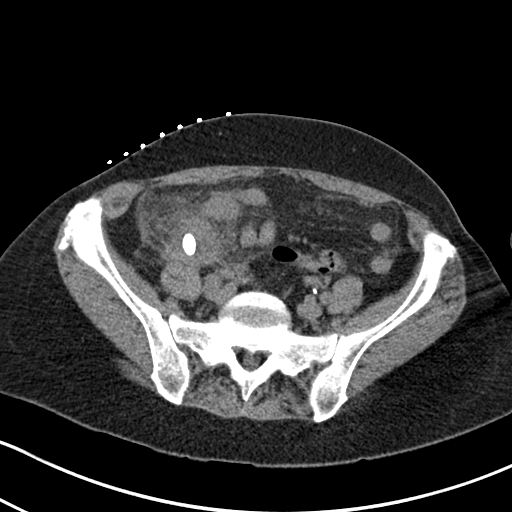
[im 19/33  soft-tissue]
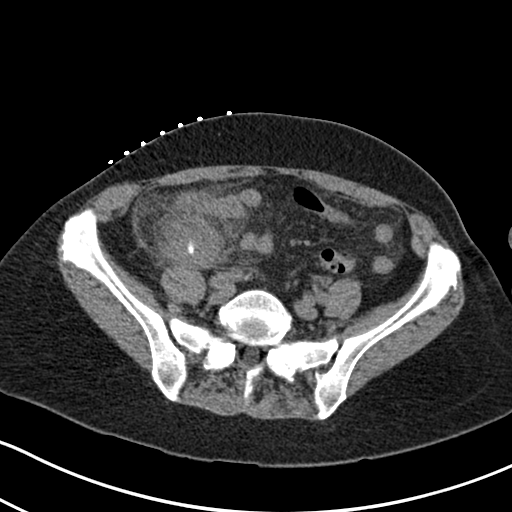
[im 22/33  soft-tissue]
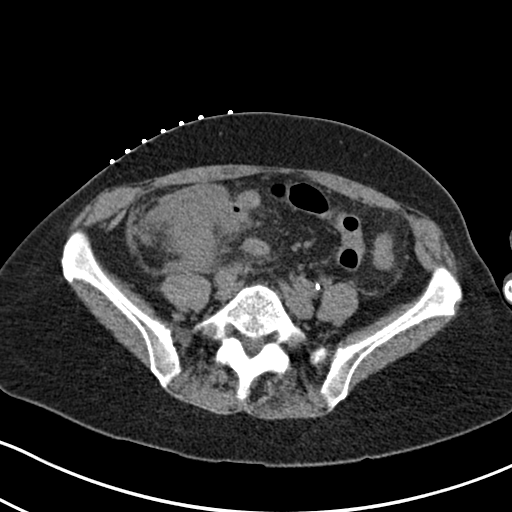
[im 22/33  lung]
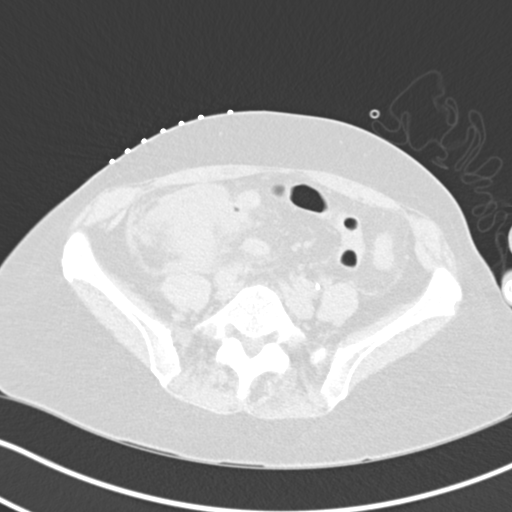
[im 25/33  soft-tissue]
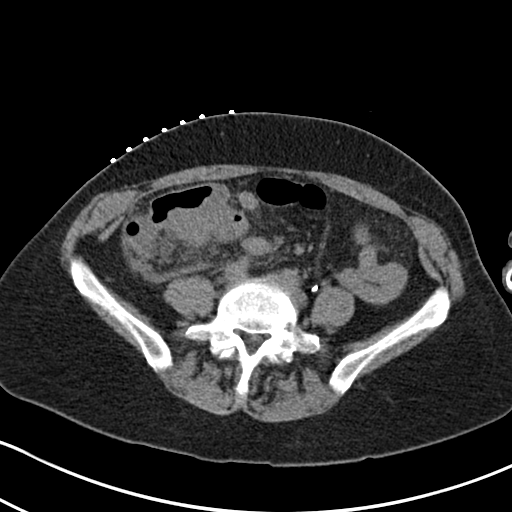
[im 25/33  lung]
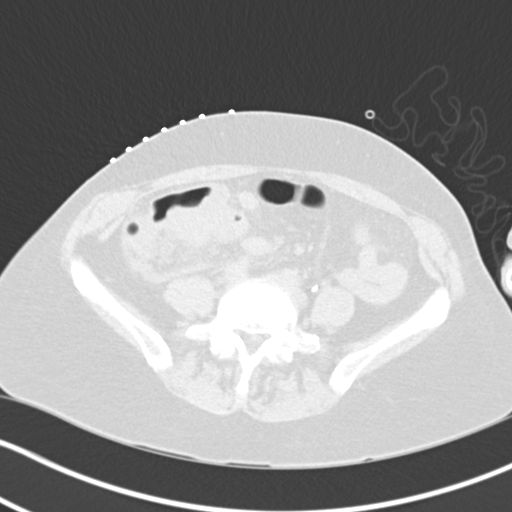
[im 25/33  bone]
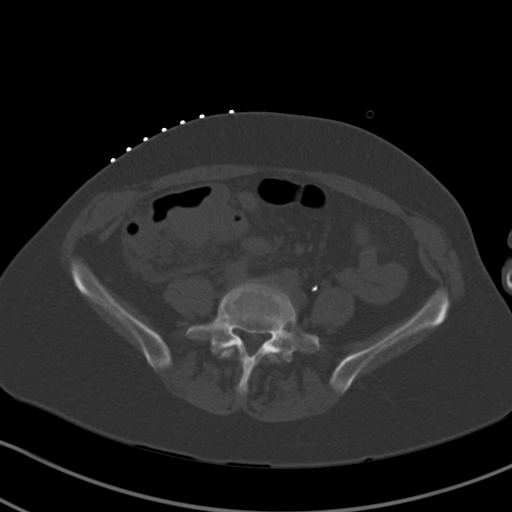
[im 27/33  soft-tissue]
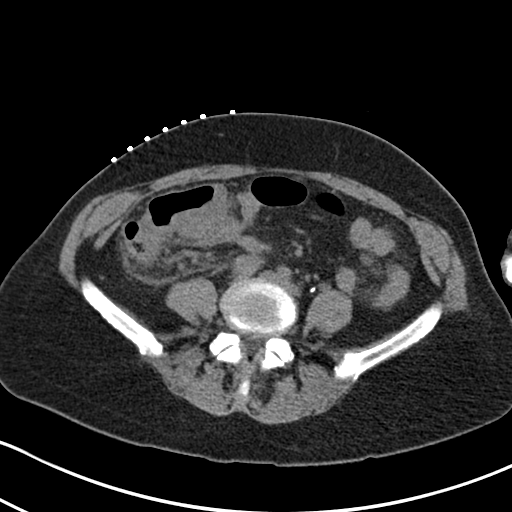
[im 27/33  lung]
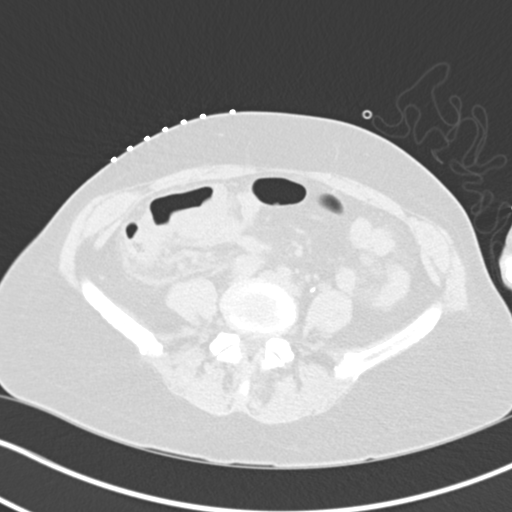
[im 30/33  soft-tissue]
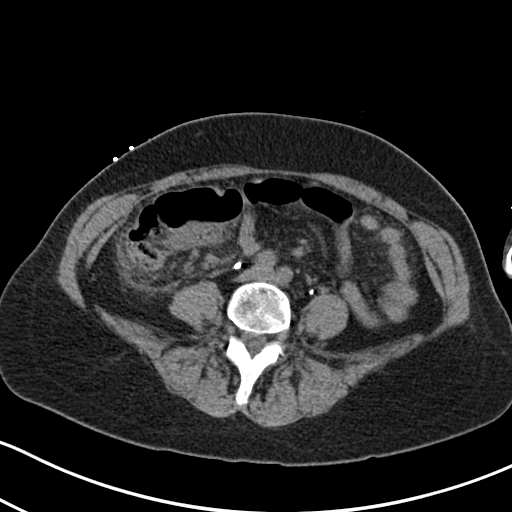
[im 30/33  lung]
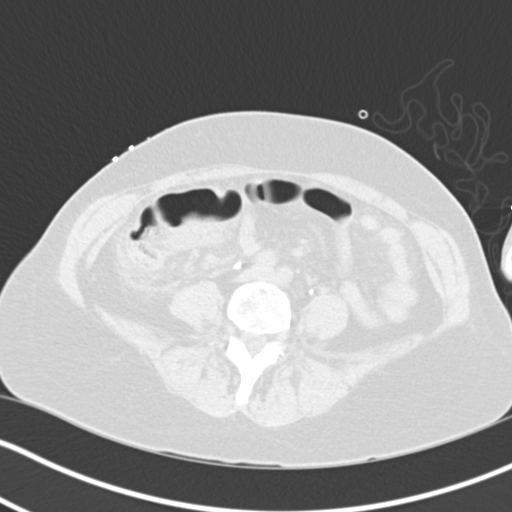

[11 of 32 positions shown; findings below may reference images not displayed]

MEDICATIONS:
No additional medications.

ANESTHESIA/SEDATION:
Fentanyl 75 mcg IV; Versed 2.5 mg IV

Moderate Sedation Time:  30 minutes.

The patient was continuously monitored during the procedure by the
interventional radiology nurse under my direct supervision.

COMPLICATIONS:
None immediate.

PROCEDURE:
Informed written consent was obtained from the patient after a
thorough discussion of the procedural risks, benefits and
alternatives. All questions were addressed. Maximal Sterile Barrier
Technique was utilized including caps, mask, sterile gowns, sterile
gloves, sterile drape, hand hygiene and skin antiseptic. A timeout
was performed prior to the initiation of the procedure.

CT was performed in a supine position. After localizing a site for
percutaneous entry, an 18 gauge trocar needle was advanced into an
abscess adjacent to a ruptured appendix. After confirming needle tip
position, a small amount of fluid was aspirated and a guidewire
advanced. The needle was removed over the wire.

The percutaneous tract was dilated and a 10 French percutaneous
drain advanced over the wire. Catheter positioning was confirmed by
CT. The drain was additionally aspirated and a 5 mL fluid sample
sent for culture analysis. The drain was then flushed with sterile
saline and attached to a suction bulb. It was secured at the skin
with a Prolene retention suture and adhesive StatLock device.
FINDINGS: Aspiration at the level of the focal abscess located medial to the
large calcified appendicolith yielded bloody and purulent fluid. A
drain was able to be formed in the collection.
IMPRESSION: CT-guided percutaneous catheter drainage of appendiceal abscess with
yield of bloody and purulent fluid. A fluid sample was sent for
culture analysis. The drainage catheter was attached to suction bulb
drainage.

## 2019-02-01 IMAGING — CT CT ABD-PELV W/ CM
2 of 5 series · 16 of 46 positions shown, 18 images · IV contrast (APPLIED)
Comparison: Earlier today

CLINICAL DATA: Follow-up perforated appendicitis.

EXAM:
CT ABDOMEN AND PELVIS WITH CONTRAST
TECHNIQUE: Multidetector CT imaging of the abdomen and pelvis was performed
using the standard protocol following bolus administration of
intravenous contrast.
CONTRAST:  75mL OMNIPAQUE IOHEXOL 300 MG/ML  SOLN

[Series 2: routine abd/pel with · axial · 0.67mm/px · z∈[-1059,-659]mm · 13 of 91 slices shown, 15 images]
[im 6/91  soft-tissue]
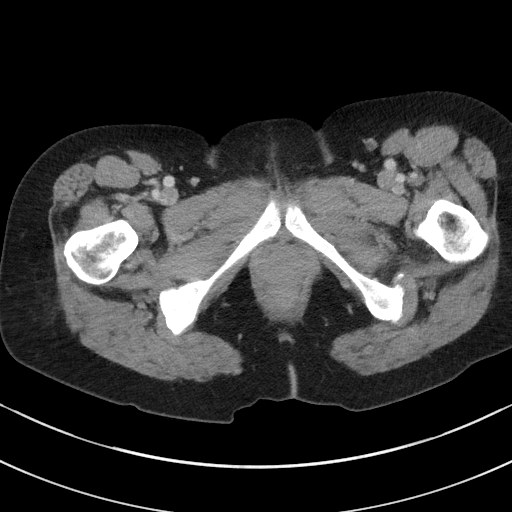
[im 6/91  bone]
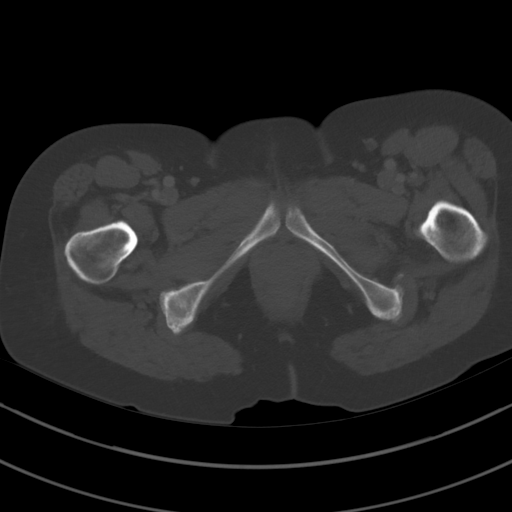
[im 11/91  soft-tissue]
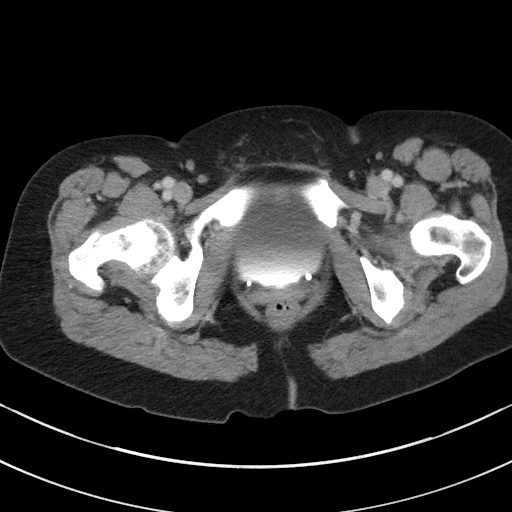
[im 21/91  soft-tissue]
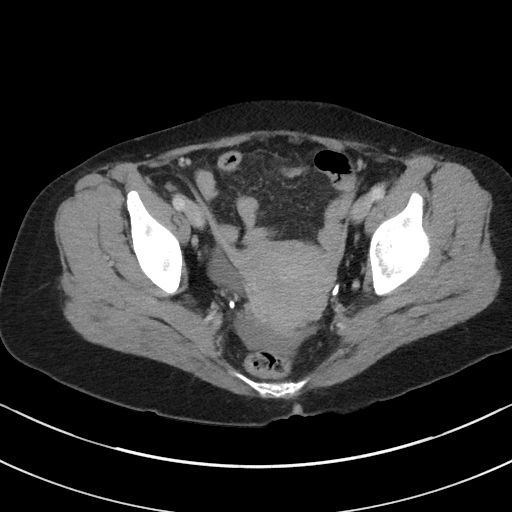
[im 26/91  soft-tissue]
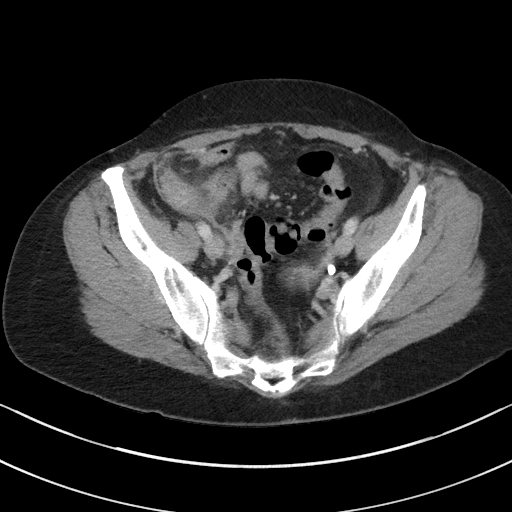
[im 31/91  soft-tissue]
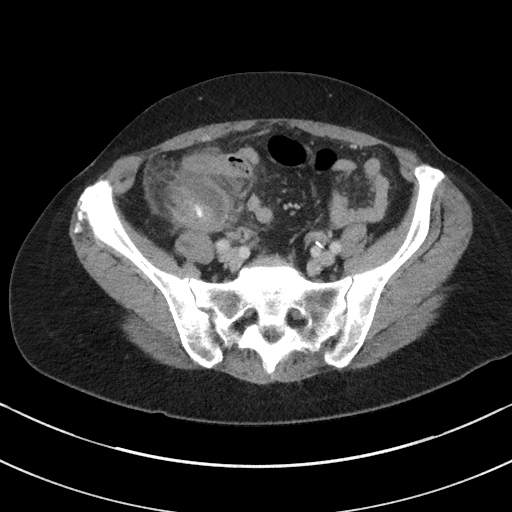
[im 41/91  soft-tissue]
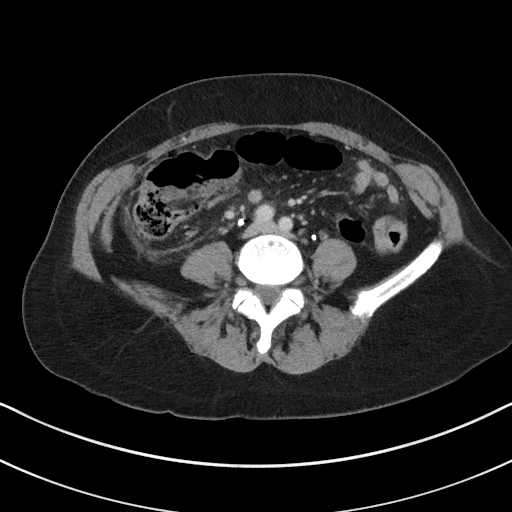
[im 46/91  soft-tissue]
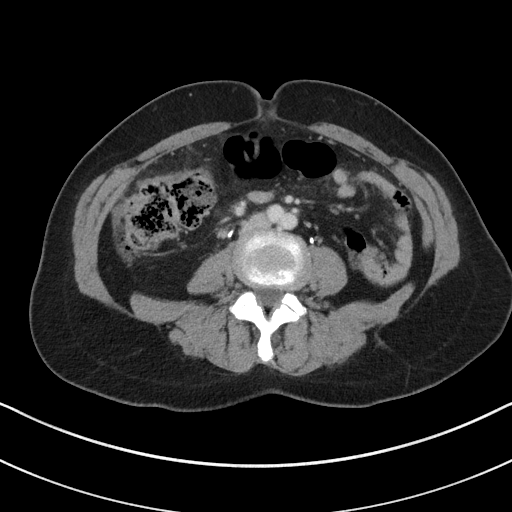
[im 51/91  soft-tissue]
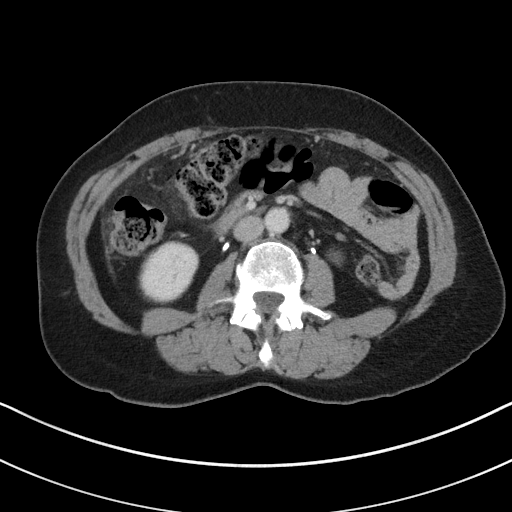
[im 61/91  soft-tissue]
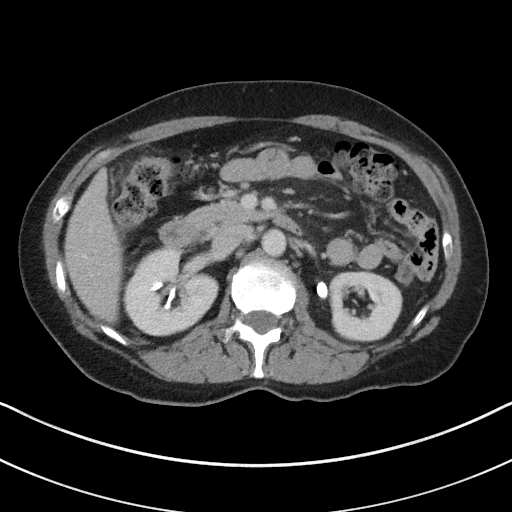
[im 61/91  bone]
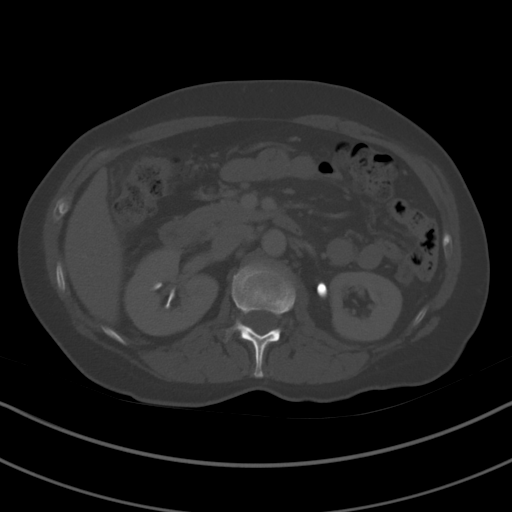
[im 66/91  soft-tissue]
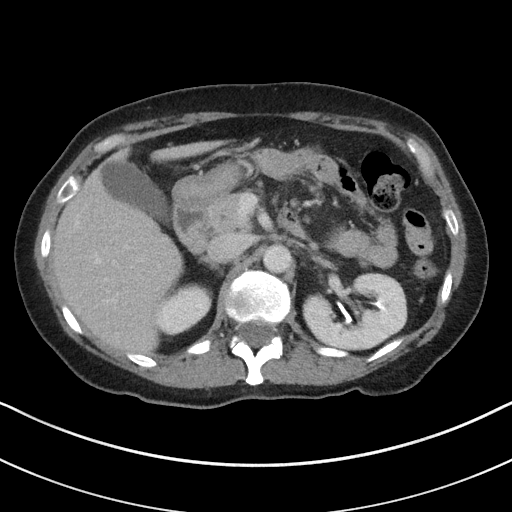
[im 71/91  soft-tissue]
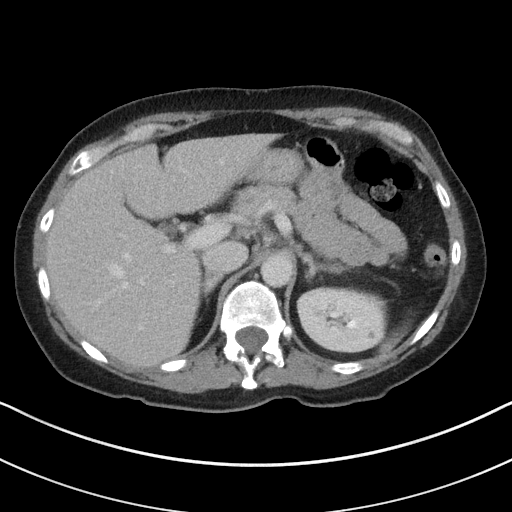
[im 81/91  soft-tissue]
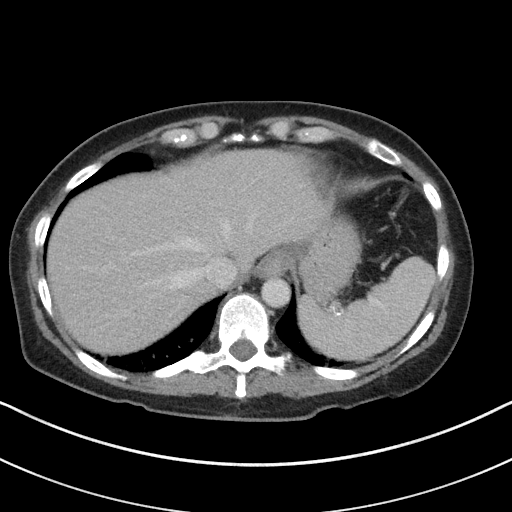
[im 86/91  soft-tissue]
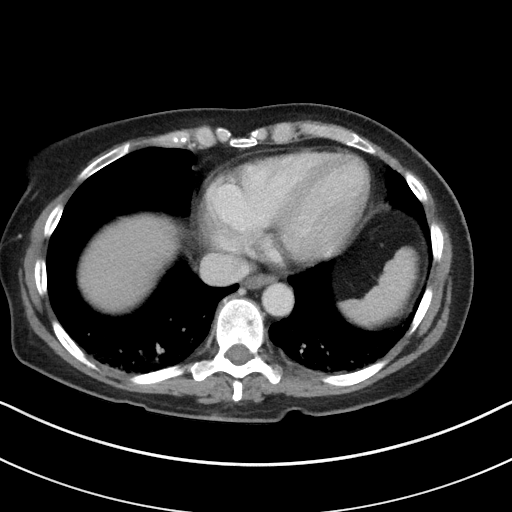

[Series 5: coronal st · coronal · 0.65mm/px · 3 of 81 slices shown]
[im 27/81  soft-tissue]
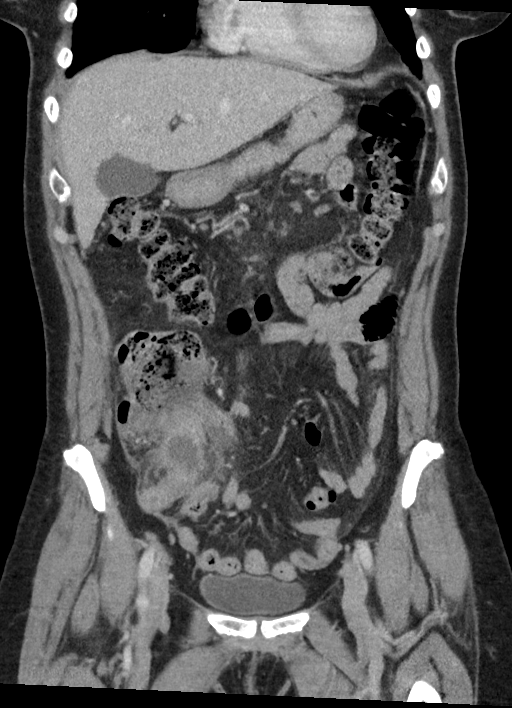
[im 36/81  soft-tissue]
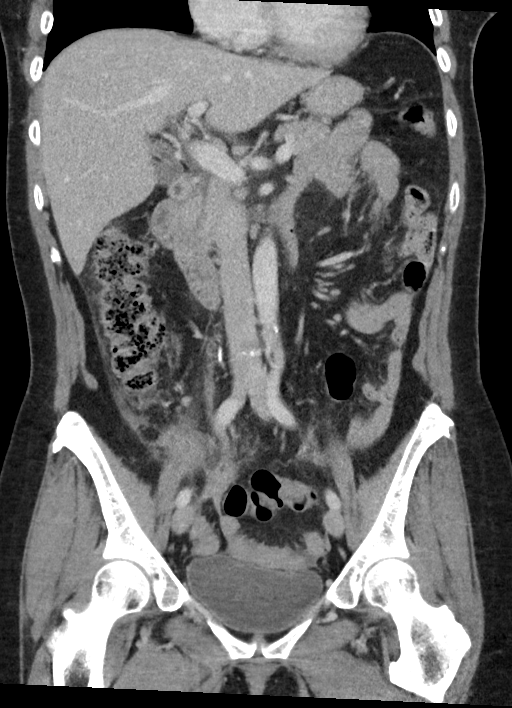
[im 45/81  soft-tissue]
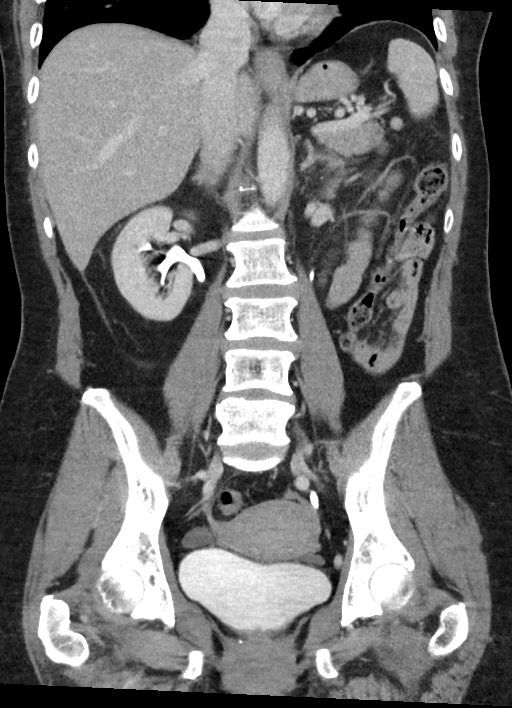

[16 of 46 positions shown; findings below may reference images not displayed]

FINDINGS: Lower chest:  No contributory findings.

Hepatobiliary: No focal liver abnormality.No evidence of biliary
obstruction or stone.

Pancreas: Unremarkable.

Spleen: Unremarkable.

Adrenals/Urinary Tract: Negative adrenals. No hydronephrosis or
stone. Unremarkable bladder.

Stomach/Bowel: Known acute appendicitis with large appendicoliths.
There is perforation at the appendiceal base with 30 x 12 x 16 mm
abscess.

Vascular/Lymphatic: No acute vascular abnormality. Reactive
ileocolic lymph nodes.

Reproductive:No pathologic findings.

Other: No ascites or pneumoperitoneum.

Musculoskeletal: No acute abnormalities.
IMPRESSION: Known perforated appendicitis with 30 x 12 x 16 mm abscess.
Appendicolith is present.

## 2019-02-10 ENCOUNTER — Other Ambulatory Visit: Payer: Self-pay

## 2019-02-10 ENCOUNTER — Ambulatory Visit
Admission: RE | Admit: 2019-02-10 | Discharge: 2019-02-10 | Disposition: A | Discharge status: Home / Self Care | Payer: BC Managed Care – PPO | Source: Ambulatory Visit | Attending: General Surgery | Admitting: General Surgery

## 2019-02-10 DIAGNOSIS — K651 Peritoneal abscess: Secondary | ICD-10-CM | POA: Diagnosis not present

## 2019-02-10 MED ORDER — IOHEXOL 300 MG/ML  SOLN
85.0000 mL | Freq: Once | INTRAMUSCULAR | Status: AC | PRN
  Administered 2019-02-10: 85 mL via INTRAVENOUS

## 2019-02-17 ENCOUNTER — Telehealth: Payer: Self-pay

## 2019-02-17 LAB — LIPID PANEL WITH LDL/HDL RATIO
Cholesterol, Total: 213 mg/dL — ABNORMAL HIGH (ref 100–199)
HDL: 48 mg/dL (ref >39)
LDL Chol Calc (NIH): 142 mg/dL — ABNORMAL HIGH (ref 0–99)
LDL/HDL Ratio: 3 ratio (ref 0.0–3.2)
Triglycerides: 127 mg/dL (ref 0–149)
VLDL Cholesterol Cal: 23 mg/dL (ref 5–40)

## 2019-02-17 LAB — COMPREHENSIVE METABOLIC PANEL
ALT: 22 IU/L (ref 0–32)
AST: 30 IU/L (ref 0–40)
Albumin/Globulin Ratio: 1.6 (ref 1.2–2.2)
Albumin: 4.1 g/dL (ref 3.8–4.9)
Alkaline Phosphatase: 75 IU/L (ref 39–117)
BUN/Creatinine Ratio: 19 (ref 9–23)
BUN: 13 mg/dL (ref 6–24)
Bilirubin Total: 0.2 mg/dL (ref 0.0–1.2)
CO2: 22 mmol/L (ref 20–29)
Calcium: 9.2 mg/dL (ref 8.7–10.2)
Chloride: 107 mmol/L — ABNORMAL HIGH (ref 96–106)
Creatinine, Ser: 0.67 mg/dL (ref 0.57–1.00)
GFR calc Af Amer: 114 mL/min/{1.73_m2} (ref >59)
GFR calc non Af Amer: 99 mL/min/{1.73_m2} (ref >59)
Globulin, Total: 2.5 g/dL (ref 1.5–4.5)
Glucose: 91 mg/dL (ref 65–99)
Potassium: 4.4 mmol/L (ref 3.5–5.2)
Sodium: 141 mmol/L (ref 134–144)
Total Protein: 6.6 g/dL (ref 6.0–8.5)

## 2019-02-17 LAB — TSH: TSH: 4.67 u[IU]/mL — ABNORMAL HIGH (ref 0.450–4.500)

## 2019-02-17 NOTE — Telephone Encounter (Signed)
Viewed by Docia Chuck on 02/17/2019 1:16 PM

## 2019-02-17 NOTE — Telephone Encounter (Signed)
-----   Message from Virginia Crews, MD sent at 02/17/2019 12:58 PM EST ----- Normal blood sugar, kidney function, liver function, electrolytes.  Cholesterol is elevated, but unchanged from previous years.  10-year risk of heart disease/stroke is low at 2.1%.  No need for medication at this time, but I recommend diet low in saturated fat and regular exercise - 30 min at least 5 times per week.  TSH is slightly elevated.  Please add on a free T4 with [facility]

## 2019-02-19 ENCOUNTER — Telehealth: Payer: Self-pay

## 2019-02-19 NOTE — Telephone Encounter (Signed)
Called and LVM for pt abut her lab results. Also notified her they are viewable in her mychart and to call with any questions.

## 2019-02-19 NOTE — Telephone Encounter (Signed)
-----   Message from Virginia Crews, MD sent at 02/19/2019  8:45 AM EST ----- Normal thyroid function on confirmatory testing.  Will repeat thyroid testing in ~6 months.

## 2019-02-22 LAB — T4: T4, Total: 6.2 ug/dL (ref 4.5–12.0)

## 2019-02-22 LAB — SPECIMEN STATUS REPORT

## 2019-04-03 ENCOUNTER — Other Ambulatory Visit: Payer: Self-pay | Admitting: Family Medicine

## 2019-04-03 NOTE — Telephone Encounter (Signed)
Requested medication (s) are due for refill today: yes  Requested medication (s) are on the active medication list: yes  Last refill:  8/262020  Future visit scheduled:yes  Notes to clinic:  medication last filled by historical provider Review for refill   Requested Prescriptions  Pending Prescriptions Disp Refills   PARoxetine (PAXIL) 20 MG tablet [Pharmacy Med Name: PAROXETINE HCL 20 MG TABLET] 90 tablet 4    Sig: TAKE 1 TABLET BY [place]      Psychiatry:  Antidepressants - SSRI Failed - 04/03/2019 11:20 AM      Failed - Valid encounter within last 6 months    Recent Outpatient Visits           2 months ago Annual physical exam   [facility] Virginia Crews, MD   5 months ago Abdominal pain, unspecified abdominal location   [facility] [place], Wendee Beavers, [place]   1 year ago Annual physical exam   [place], Clearnce Sorrel, [place]   1 year ago [place], Wendee Beavers, [place]   1 year ago [place], [place], [place]              Passed - Completed PHQ-2 or PHQ-9 in the last 360 days.

## 2019-04-07 ENCOUNTER — Encounter: Payer: Self-pay | Admitting: Family Medicine

## 2019-04-08 ENCOUNTER — Other Ambulatory Visit: Payer: BC Managed Care – PPO

## 2019-04-08 ENCOUNTER — Ambulatory Visit (INDEPENDENT_AMBULATORY_CARE_PROVIDER_SITE_OTHER): Payer: BC Managed Care – PPO | Admitting: Physician Assistant

## 2019-04-08 ENCOUNTER — Encounter: Payer: Self-pay | Admitting: Physician Assistant

## 2019-04-08 ENCOUNTER — Other Ambulatory Visit: Payer: Self-pay

## 2019-04-08 ENCOUNTER — Other Ambulatory Visit: Payer: Self-pay | Admitting: Physician Assistant

## 2019-04-08 DIAGNOSIS — Z20828 Contact with and (suspected) exposure to other viral communicable diseases: Secondary | ICD-10-CM | POA: Diagnosis not present

## 2019-04-08 DIAGNOSIS — B001 Herpesviral vesicular dermatitis: Secondary | ICD-10-CM

## 2019-04-08 DIAGNOSIS — Z20822 Contact with and (suspected) exposure to covid-19: Secondary | ICD-10-CM

## 2019-04-08 NOTE — Progress Notes (Signed)
       Patient: Pamela Taylor Female    DOB: [birth year]   53 y.o.   MRN: [id number] Visit Date: 04/08/2019  Today's Provider: Mar Daring, PA-C   No chief complaint on file.  Subjective:     HPI   Virtual Visit via Telephone Note  I connected with Francesco Runner on 04/08/19 at  8:40 AM EST by telephone and verified that I am speaking with the correct person using two identifiers.  Location: Patient: Car Provider: [facility]   I discussed the limitations, risks, security and privacy concerns of performing an evaluation and management service by telephone and the availability of in person appointments. I also discussed with the patient that there may be a patient responsible charge related to this service. The patient expressed understanding and agreed to proceed.  Pt was informed she was in close contact with a person from work who tested positive for COVID.  She states she was in contact with them on 04/02/2019.  She reports she was wearing a mask when around them. She does not have symptoms right now "Just my typical allergy symptoms."      No Known Allergies   Current Outpatient Medications:  .  ALPRAZolam (XANAX) 0.5 MG tablet, Take 1 tablet (0.5 mg total) by mouth daily as needed for anxiety., Disp: 90 tablet, Rfl: 1 .  PARoxetine (PAXIL) 20 MG tablet, TAKE 1 TABLET BY MOUTH EVERY DAY, Disp: 90 tablet, Rfl: 4  Review of Systems  Constitutional: Negative.   Respiratory: Negative.   Gastrointestinal: Negative.   Allergic/Immunologic: Positive for environmental allergies.    Social History   Tobacco Use  . Smoking status: Never Smoker  . Smokeless tobacco: Never Used  Substance Use Topics  . Alcohol use: Yes    Comment: Occasional       Objective:   LMP 11/14/2015  There were no vitals filed for this visit.There is no height or weight on file to calculate BMI.   Physical Exam Vitals reviewed.  Pulmonary:     Effort: No respiratory distress.    Neurological:     Mental Status: She is alert.      No results found for any visits on 04/08/19.     Assessment & Plan    1. Close exposure to COVID-19 virus Patient was scheduled while on the phone for [facility] testing today at 945. Advised to isolate as best possible.   I discussed the assessment and treatment plan with the patient. The patient was provided an opportunity to ask questions and all were answered. The patient agreed with the plan and demonstrated an understanding of the instructions.   The patient was advised to call back or seek an in-person evaluation if the symptoms worsen or if the condition fails to improve as anticipated.  I provided 12 minutes of non-face-to-face time during this encounter.      Mar Daring, PA-C  [facility]

## 2019-04-12 NOTE — Telephone Encounter (Signed)
Please review for Dr. B ° ° °Thanks,  ° °-Jenayah Antu  °

## 2019-04-19 ENCOUNTER — Ambulatory Visit: Payer: BC Managed Care – PPO | Attending: Internal Medicine

## 2019-04-19 DIAGNOSIS — Z20822 Contact with and (suspected) exposure to covid-19: Secondary | ICD-10-CM

## 2019-04-20 LAB — NOVEL CORONAVIRUS, NAA: SARS-CoV-2, NAA: NOT DETECTED

## 2019-05-25 ENCOUNTER — Encounter: Payer: Self-pay | Admitting: Family Medicine

## 2019-05-25 ENCOUNTER — Ambulatory Visit: Payer: BC Managed Care – PPO | Attending: Internal Medicine

## 2019-05-25 DIAGNOSIS — Z20822 Contact with and (suspected) exposure to covid-19: Secondary | ICD-10-CM

## 2019-05-26 LAB — NOVEL CORONAVIRUS, NAA: SARS-CoV-2, NAA: NOT DETECTED

## 2019-05-28 ENCOUNTER — Telehealth: Payer: BC Managed Care – PPO | Admitting: Family Medicine

## 2019-07-06 ENCOUNTER — Other Ambulatory Visit: Payer: Self-pay | Admitting: Physician Assistant

## 2019-07-06 DIAGNOSIS — B001 Herpesviral vesicular dermatitis: Secondary | ICD-10-CM

## 2019-07-06 NOTE — Telephone Encounter (Signed)
Requested Prescriptions  Pending Prescriptions Disp Refills  . valACYclovir (VALTREX) 1000 MG tablet [Pharmacy Med Name: VALACYCLOVIR HCL 1 GRAM TABLET] 90 tablet 2    Sig: TAKE 1 TABLET (1,000 MG TOTAL) BY MOUTH DAILY AS NEEDED (COLD SORES).     Antimicrobials:  Antiviral Agents - Anti-Herpetic Passed - 07/06/2019  1:30 AM      Passed - Valid encounter within last 12 months    Recent Outpatient Visits          2 months ago Close exposure to COVID-19 virus   [facility], Alessandra Bevels, PA-C   5 months ago Annual physical exam   [facility] [place], Marzella Schlein, [place]   8 months ago Abdominal pain, unspecified abdominal location   [facility] [place], Lavella Hammock, [place]   1 year ago Annual physical exam   [facility] [place], Alessandra Bevels, [place]   2 years ago Bronchitis   [facility] [place], Ricki Rodriguez [place], [place]

## 2019-11-27 ENCOUNTER — Encounter: Payer: Self-pay | Admitting: Family Medicine

## 2019-12-30 ENCOUNTER — Ambulatory Visit: Payer: BC Managed Care – PPO | Admitting: Family Medicine

## 2020-01-11 ENCOUNTER — Other Ambulatory Visit: Payer: Self-pay | Admitting: Family Medicine

## 2020-01-11 DIAGNOSIS — Z1231 Encounter for screening mammogram for malignant neoplasm of breast: Secondary | ICD-10-CM

## 2020-01-31 ENCOUNTER — Other Ambulatory Visit: Payer: Self-pay | Admitting: Family Medicine

## 2020-01-31 ENCOUNTER — Other Ambulatory Visit: Payer: Self-pay

## 2020-01-31 ENCOUNTER — Ambulatory Visit
Admission: RE | Admit: 2020-01-31 | Discharge: 2020-01-31 | Disposition: A | Discharge status: Home / Self Care | Payer: BC Managed Care – PPO | Source: Ambulatory Visit | Attending: Family Medicine | Admitting: Family Medicine

## 2020-01-31 ENCOUNTER — Ambulatory Visit (INDEPENDENT_AMBULATORY_CARE_PROVIDER_SITE_OTHER): Payer: BC Managed Care – PPO | Admitting: Family Medicine

## 2020-01-31 ENCOUNTER — Encounter: Payer: Self-pay | Admitting: Family Medicine

## 2020-01-31 VITALS — BP 119/80 | HR 64 | Temp 98.2°F | Resp 16 | Ht 62.0 in | Wt 145.4 lb

## 2020-01-31 DIAGNOSIS — Z0000 Encounter for general adult medical examination without abnormal findings: Secondary | ICD-10-CM

## 2020-01-31 DIAGNOSIS — Z1231 Encounter for screening mammogram for malignant neoplasm of breast: Secondary | ICD-10-CM

## 2020-01-31 DIAGNOSIS — Z862 Personal history of diseases of the blood and blood-forming organs and certain disorders involving the immune mechanism: Secondary | ICD-10-CM

## 2020-01-31 DIAGNOSIS — E7800 Pure hypercholesterolemia, unspecified: Secondary | ICD-10-CM | POA: Diagnosis not present

## 2020-01-31 DIAGNOSIS — F411 Generalized anxiety disorder: Secondary | ICD-10-CM | POA: Diagnosis not present

## 2020-01-31 DIAGNOSIS — F341 Dysthymic disorder: Secondary | ICD-10-CM | POA: Diagnosis not present

## 2020-01-31 DIAGNOSIS — E038 Other specified hypothyroidism: Secondary | ICD-10-CM

## 2020-01-31 MED ORDER — PAROXETINE HCL 40 MG PO TABS: 40.0000 mg | ORAL_TABLET | Freq: Every day | ORAL | 1 refills | Status: DC

## 2020-01-31 NOTE — Progress Notes (Signed)
Complete physical exam   Patient: Pamela Taylor   DOB: Mar 25, 1966   54 y.o. Female  MRN: [id number] Visit Date: 01/31/2020  Today's healthcare provider: Shirlee LatchAngela Elsey Holts, MD   Chief Complaint  Patient presents with  . Annual Exam   Subjective     HPI  Pamela Taylor is a 54 y.o. female who presents today for a complete physical exam.  She reports consuming a general diet. Gym/ health club routine includes walking on track . She generally feels well. She reports sleeping well. She does have additional problems to discuss today, patient states that over the past two weeks her depression has seem to increase.  Last reported Pap 11/18/17  ( repeat 3-5 yrs)  Past Medical History:  Diagnosis Date  . Anemia 05/17/2009  . Depression   . Hyperlipidemia    Past Surgical History:  Procedure Laterality Date  . BTL  1993  . CESAREAN SECTION  [id number],1992,1993  . EYE SURGERY    . HAND SURGERY Right    TRIGGER FINGER  . IR RADIOLOGIST EVAL & MGMT  05/26/2018  . IR RADIOLOGIST EVAL & MGMT  06/18/2018  . LAPAROSCOPIC APPENDECTOMY N/A 07/06/2018   Procedure: APPENDECTOMY LAPAROSCOPIC;  Surgeon: Carolan Shiverintron-Diaz, Edgardo, MD;  Location: [facility];  Service: General;  Laterality: N/A;  . LAPAROSCOPY N/A 10/30/2018   Procedure: Laparoscopic drainage of intraabdominal abscess;  Surgeon: Carolan Shiverintron-Diaz, Edgardo, MD;  Location: [facility];  Service: General;  Laterality: N/A;  . REFRACTIVE SURGERY Bilateral 2003   Social History   Socioeconomic History  . Marital status: Married    Spouse name: Not on file  . Number of children: Not on file  . Years of education: Not on file  . Highest education level: Not on file  Occupational History  . Not on file  Tobacco Use  . Smoking status: Never Smoker  . Smokeless tobacco: Never Used  Vaping Use  . Vaping Use: Never used  Substance and Sexual Activity  . Alcohol use: Yes    Comment: Occasional   . Drug use: No  . Sexual activity: Not on file    Other Topics Concern  . Not on file  Social History Narrative  . Not on file   Social Determinants of Health   Financial Resource Strain:   . Difficulty of Paying Living Expenses: Not on file  Food Insecurity:   . Worried About Programme researcher, broadcasting/film/videounning Out of Food in the Last Year: Not on file  . Ran Out of Food in the Last Year: Not on file  Transportation Needs:   . Lack of Transportation (Medical): Not on file  . Lack of Transportation (Non-Medical): Not on file  Physical Activity:   . Days of Exercise per Week: Not on file  . Minutes of Exercise per Session: Not on file  Stress:   . Feeling of Stress : Not on file  Social Connections:   . Frequency of Communication with Friends and Family: Not on file  . Frequency of Social Gatherings with Friends and Family: Not on file  . Attends Religious Services: Not on file  . Active Member of Clubs or Organizations: Not on file  . Attends BankerClub or Organization Meetings: Not on file  . Marital Status: Not on file  Intimate Partner Violence:   . Fear of Current or Ex-Partner: Not on file  . Emotionally Abused: Not on file  . Physically Abused: Not on file  . Sexually Abused: Not on file   Family Status  Relation Name Status  . Father  (Not Specified)       substance abuse, alcoholism in revocery. Early Dementia, CHF. No previous AMI; emphysema  . Sister  Alive  . Brother  Alive  . Mother  (Not Specified)   Family History  Problem Relation Age of Onset  . Depression Father   . Cancer Mother 79       Breast cancer  . Breast cancer Mother 65   No Known Allergies  Patient Care Team: Erasmo Downer, MD as PCP - General (Family Medicine)   Medications: Outpatient Medications Prior to Visit  Medication Sig  . ALPRAZolam (XANAX) 0.5 MG tablet Take 1 tablet (0.5 mg total) by mouth daily as needed for anxiety.  . valACYclovir (VALTREX) 1000 MG tablet TAKE 1 TABLET (1,000 MG TOTAL) BY MOUTH DAILY AS NEEDED (COLD SORES).  . [DISCONTINUED]  PARoxetine (PAXIL) 20 MG tablet TAKE 1 TABLET BY MOUTH EVERY DAY   No facility-administered medications prior to visit.    Review of Systems  Constitutional: Negative.   HENT: Negative.   Eyes: Negative.   Respiratory: Negative.   Cardiovascular: Negative.   Gastrointestinal: Negative.   Endocrine: Negative.   Genitourinary: Negative.   Skin: Negative.   Allergic/Immunologic: Negative.   Neurological: Negative.   Hematological: Negative.   Psychiatric/Behavioral: Positive for dysphoric mood. Negative for agitation, behavioral problems, confusion, decreased concentration, hallucinations, self-injury, sleep disturbance and suicidal ideas. The patient is nervous/anxious. The patient is not hyperactive.       Objective    BP 119/80   Pulse 64   Temp 98.2 F (36.8 C) (Oral)   Resp 16   Ht 5\' 2"  (1.575 m)   Wt 145 lb 6.4 oz (66 kg)   LMP 11/14/2015   SpO2 100%   BMI 26.59 kg/m    Physical Exam Vitals reviewed.  Constitutional:      General: She is not in acute distress.    Appearance: Normal appearance. She is well-developed. She is not diaphoretic.  HENT:     Head: Normocephalic and atraumatic.     Right Ear: Tympanic membrane, ear canal and external ear normal.     Left Ear: Tympanic membrane, ear canal and external ear normal.     Nose: Nose normal.     Mouth/Throat:     Mouth: Mucous membranes are moist.     Pharynx: Oropharynx is clear. No oropharyngeal exudate.  Eyes:     General: No scleral icterus.    Conjunctiva/sclera: Conjunctivae normal.     Pupils: Pupils are equal, round, and reactive to light.  Neck:     Thyroid: No thyromegaly.  Cardiovascular:     Rate and Rhythm: Normal rate and regular rhythm.     Pulses: Normal pulses.     Heart sounds: Normal heart sounds. No murmur heard.   Pulmonary:     Effort: Pulmonary effort is normal. No respiratory distress.     Breath sounds: Normal breath sounds. No wheezing or rales.  Abdominal:     General:  There is no distension.     Palpations: Abdomen is soft.     Tenderness: There is no abdominal tenderness.  Musculoskeletal:        General: No deformity.     Cervical back: Neck supple.     Right lower leg: No edema.     Left lower leg: No edema.  Lymphadenopathy:     Cervical: No cervical adenopathy.  Skin:    General: Skin  is warm and dry.     Findings: No rash.  Neurological:     Mental Status: She is alert and oriented to person, place, and time. Mental status is at baseline.     Sensory: No sensory deficit.     Motor: No weakness.     Gait: Gait normal.  Psychiatric:        Mood and Affect: Mood is depressed. Affect is tearful.        Speech: Speech normal.        Behavior: Behavior normal.        Thought Content: Thought content normal. Thought content does not include homicidal or suicidal ideation.     Last depression screening scores PHQ 2/9 Scores 01/31/2020 01/27/2019 09/06/2015  PHQ - 2 Score 1 1 0  PHQ- 9 Score 2 2 -   Last fall risk screening Fall Risk  01/31/2020  Falls in the past year? 0  Number falls in past yr: 0  Injury with Fall? 0   Last Audit-C alcohol use screening Alcohol Use Disorder Test (AUDIT) 01/27/2019  1. How often do you have a drink containing alcohol? 2  2. How many drinks containing alcohol do you have on a typical day when you are drinking? 0  3. How often do you have six or more drinks on one occasion? 0  AUDIT-C Score 2  Alcohol Brief Interventions/Follow-up -   A score of 3 or more in women, and 4 or more in men indicates increased risk for alcohol abuse, EXCEPT if all of the points are from question 1   No results found for any visits on 01/31/20.  Assessment & Plan    Routine Health Maintenance and Physical Exam  Exercise Activities and Dietary recommendations Goals   None     Immunization History  Administered Date(s) Administered  . Influenza Inj Mdck Quad Pf 02/02/2018  . Influenza Split 04/27/2009  .  Influenza,inj,Quad PF,6+ Mos 01/06/2014, 01/05/2016, 02/02/2018, 01/27/2019, 01/10/2020  . Influenza-Unspecified 02/13/2017, 12/23/2018, 01/10/2020  . PFIZER SARS-COV-2 Vaccination 01/10/2020  . Tdap 11/18/2017  . Zoster Recombinat (Shingrix) 12/11/2016, 01/30/2018    Health Maintenance  Topic Date Due  . COVID-19 Vaccine (2 - Pfizer 2-dose series) 01/31/2020  . MAMMOGRAM  01/25/2021  . PAP SMEAR-Modifier  11/19/2022  . COLONOSCOPY  09/14/2023  . TETANUS/TDAP  11/19/2027  . INFLUENZA VACCINE  Completed  . Hepatitis C Screening  Completed  . HIV Screening  Completed    Discussed health benefits of physical activity, and encouraged her to engage in regular exercise appropriate for her age and condition.  Problem List Items Addressed This Visit      Endocrine   Subclinical hypothyroidism    TSH elevated at last check with normal free T4 Possibility of sick euthyroid syndrome Not on medication for this currently Recheck TSH today      Relevant Orders   TSH     Other   GAD (generalized anxiety disorder)    Recent exacerbation Continue Xanax as needed Increase Paxil to 40 mg daily as above      Relevant Medications   PARoxetine (PAXIL) 40 MG tablet   Clinical depression    Dysthymia Previously improved with Paxil, but worsening over the last month or so Contracted for safety-no SI/HI Increase Paxil to 40 mg daily Encourage therapy Can follow-up sooner if not having improvement or needs additional treatment Repeat PHQ-9 at next visit      Relevant Medications   PARoxetine (PAXIL) 40  MG tablet   Hypercholesterolemia without hypertriglyceridemia    Reviewed last lipid panel Not currently on a statin Recheck FLP and CMP Discussed diet and exercise       Relevant Orders   Lipid panel   Comprehensive metabolic panel    Other Visit Diagnoses    Encounter for annual physical exam    -  Primary   Relevant Orders   TSH   Lipid panel   CBC w/Diff/Platelet    Comprehensive metabolic panel   History of anemia       Relevant Orders   CBC w/Diff/Platelet       Return in about 1 year (around 01/30/2021) for CPE.     I, Shirlee Latch, MD, have reviewed all documentation for this visit. The documentation on 01/31/20 for the exam, diagnosis, procedures, and orders are all accurate and complete.   Taliesin Hartlage, Marzella Schlein, MD, MPH [facility]

## 2020-01-31 NOTE — Patient Instructions (Signed)
Congratulations on your Covid-19 vaccination!  Please bring in your Covid-19 vaccination card to your next appointment or upload a picture to your MyChart for [place] to keep track of your vaccinations.  Thank you!    Preventive Care 7-56 Years Old, Female Preventive care refers to visits with your health care provider and lifestyle choices that can promote health and wellness. This includes:  A yearly physical exam. This may also be called an annual well check.  Regular dental visits and eye exams.  Immunizations.  Screening for certain conditions.  Healthy lifestyle choices, such as eating a healthy diet, getting regular exercise, not using drugs or products that contain nicotine and tobacco, and limiting alcohol use. What can I expect for my preventive care visit? Physical exam Your health care provider will check your:  Height and weight. This may be used to calculate body mass index (BMI), which tells if you are at a healthy weight.  Heart rate and blood pressure.  Skin for abnormal spots. Counseling Your health care provider may ask you questions about your:  Alcohol, tobacco, and drug use.  Emotional well-being.  Home and relationship well-being.  Sexual activity.  Eating habits.  Work and work Statistician.  Method of birth control.  Menstrual cycle.  Pregnancy history. What immunizations do I need?  Influenza (flu) vaccine  This is recommended every year. Tetanus, diphtheria, and pertussis (Tdap) vaccine  You may need a Td booster every 10 years. Varicella (chickenpox) vaccine  You may need this if you have not been vaccinated. Zoster (shingles) vaccine  You may need this after age 11. Measles, mumps, and rubella (MMR) vaccine  You may need at least one dose of MMR if you were born in 1957 or later. You may also need a second dose. Pneumococcal conjugate (PCV13) vaccine  You may need this if you have certain conditions and were not previously  vaccinated. Pneumococcal polysaccharide (PPSV23) vaccine  You may need one or two doses if you smoke cigarettes or if you have certain conditions. Meningococcal conjugate (MenACWY) vaccine  You may need this if you have certain conditions. Hepatitis A vaccine  You may need this if you have certain conditions or if you travel or work in places where you may be exposed to hepatitis A. Hepatitis B vaccine  You may need this if you have certain conditions or if you travel or work in places where you may be exposed to hepatitis B. Haemophilus influenzae type b (Hib) vaccine  You may need this if you have certain conditions. Human papillomavirus (HPV) vaccine  If recommended by your health care provider, you may need three doses over 6 months. You may receive vaccines as individual doses or as more than one vaccine together in one shot (combination vaccines). Talk with your health care provider about the risks and benefits of combination vaccines. What tests do I need? Blood tests  Lipid and cholesterol levels. These may be checked every 5 years, or more frequently if you are over 22 years old.  Hepatitis C test.  Hepatitis B test. Screening  Lung cancer screening. You may have this screening every year starting at age 65 if you have a 30-pack-year history of smoking and currently smoke or have quit within the past 15 years.  Colorectal cancer screening. All adults should have this screening starting at age 15 and continuing until age 79. Your health care provider may recommend screening at age 35 if you are at increased risk. You will have tests  every 1-10 years, depending on your results and the type of screening test.  Diabetes screening. This is done by checking your blood sugar (glucose) after you have not eaten for a while (fasting). You may have this done every 1-3 years.  Mammogram. This may be done every 1-2 years. Talk with your health care provider about when you should start  having regular mammograms. This may depend on whether you have a family history of breast cancer.  BRCA-related cancer screening. This may be done if you have a family history of breast, ovarian, tubal, or peritoneal cancers.  Pelvic exam and Pap test. This may be done every 3 years starting at age 66. Starting at age 67, this may be done every 5 years if you have a Pap test in combination with an HPV test. Other tests  Sexually transmitted disease (STD) testing.  Bone density scan. This is done to screen for osteoporosis. You may have this scan if you are at high risk for osteoporosis. Follow these instructions at home: Eating and drinking  Eat a diet that includes fresh fruits and vegetables, whole grains, lean protein, and low-fat dairy.  Take vitamin and mineral supplements as recommended by your health care provider.  Do not drink alcohol if: ? Your health care provider tells you not to drink. ? You are pregnant, may be pregnant, or are planning to become pregnant.  If you drink alcohol: ? Limit how much you have to 0-1 drink a day. ? Be aware of how much alcohol is in your drink. In the [place] one drink equals one 12 oz bottle of beer (355 mL), one 5 oz glass of wine (148 mL), or one 1 oz glass of hard liquor (44 mL). Lifestyle  Take daily care of your teeth and gums.  Stay active. Exercise for at least 30 minutes on 5 or more days each week.  Do not use any products that contain nicotine or tobacco, such as cigarettes, e-cigarettes, and chewing tobacco. If you need help quitting, ask your health care provider.  If you are sexually active, practice safe sex. Use a condom or other form of birth control (contraception) in order to prevent pregnancy and STIs (sexually transmitted infections).  If told by your health care provider, take low-dose aspirin daily starting at age 27. What's next?  Visit your health care provider once a year for a well check visit.  Ask your health  care provider how often you should have your eyes and teeth checked.  Stay up to date on all vaccines. This information is not intended to replace advice given to you by your health care provider. Make sure you discuss any questions you have with your health care provider. Document Revised: 12/11/2017 Document Reviewed: 12/11/2017 Elsevier Patient Education  2020 [employer].

## 2020-01-31 NOTE — Assessment & Plan Note (Signed)
Reviewed last lipid panel Not currently on a statin Recheck FLP and CMP Discussed diet and exercise  

## 2020-01-31 NOTE — Assessment & Plan Note (Signed)
Dysthymia Previously improved with Paxil, but worsening over the last month or so Contracted for safety-no SI/HI Increase Paxil to 40 mg daily Encourage therapy Can follow-up sooner if not having improvement or needs additional treatment Repeat PHQ-9 at next visit

## 2020-01-31 NOTE — Assessment & Plan Note (Signed)
Recent exacerbation Continue Xanax as needed Increase Paxil to 40 mg daily as above

## 2020-01-31 NOTE — Assessment & Plan Note (Signed)
TSH elevated at last check with normal free T4 Possibility of sick euthyroid syndrome Not on medication for this currently Recheck TSH today

## 2020-02-03 ENCOUNTER — Telehealth: Payer: Self-pay

## 2020-02-03 NOTE — Telephone Encounter (Signed)
-----   Message from Erasmo Downer, MD sent at 02/03/2020 10:38 AM EDT ----- Normal mammogram. Repeat in 1 yr

## 2020-02-03 NOTE — Telephone Encounter (Signed)
Written by Erasmo Downer, MD on 02/03/2020 10:38 AM EDT Seen by patient Pamela Taylor on 02/03/2020 11:49 AM

## 2020-02-16 ENCOUNTER — Encounter: Payer: Self-pay | Admitting: Family Medicine

## 2020-02-16 LAB — COMPREHENSIVE METABOLIC PANEL
ALT: 22 IU/L (ref 0–32)
AST: 26 IU/L (ref 0–40)
Albumin/Globulin Ratio: 1.6 (ref 1.2–2.2)
Albumin: 4.1 g/dL (ref 3.8–4.9)
Alkaline Phosphatase: 74 IU/L (ref 44–121)
BUN/Creatinine Ratio: 17 (ref 9–23)
BUN: 11 mg/dL (ref 6–24)
Bilirubin Total: 0.4 mg/dL (ref 0.0–1.2)
CO2: 24 mmol/L (ref 20–29)
Calcium: 9.2 mg/dL (ref 8.7–10.2)
Chloride: 103 mmol/L (ref 96–106)
Creatinine, Ser: 0.63 mg/dL (ref 0.57–1.00)
GFR calc Af Amer: 116 mL/min/{1.73_m2} (ref >59)
GFR calc non Af Amer: 101 mL/min/{1.73_m2} (ref >59)
Globulin, Total: 2.6 g/dL (ref 1.5–4.5)
Glucose: 86 mg/dL (ref 65–99)
Potassium: 4.3 mmol/L (ref 3.5–5.2)
Sodium: 140 mmol/L (ref 134–144)
Total Protein: 6.7 g/dL (ref 6.0–8.5)

## 2020-02-16 LAB — CBC WITH DIFFERENTIAL/PLATELET
Basophils Absolute: 0 10*3/uL (ref 0.0–0.2)
Basos: 1 %
EOS (ABSOLUTE): 0.2 10*3/uL (ref 0.0–0.4)
Eos: 4 %
Hematocrit: 39.7 % (ref 34.0–46.6)
Hemoglobin: 13.4 g/dL (ref 11.1–15.9)
Immature Grans (Abs): 0 10*3/uL (ref 0.0–0.1)
Immature Granulocytes: 0 %
Lymphocytes Absolute: 1.9 10*3/uL (ref 0.7–3.1)
Lymphs: 33 %
MCH: 30.8 pg (ref 26.6–33.0)
MCHC: 33.8 g/dL (ref 31.5–35.7)
MCV: 91 fL (ref 79–97)
Monocytes Absolute: 0.4 10*3/uL (ref 0.1–0.9)
Monocytes: 7 %
Neutrophils Absolute: 3.1 10*3/uL (ref 1.4–7.0)
Neutrophils: 55 %
Platelets: 271 10*3/uL (ref 150–450)
RBC: 4.35 x10E6/uL (ref 3.77–5.28)
RDW: 13.2 % (ref 11.7–15.4)
WBC: 5.6 10*3/uL (ref 3.4–10.8)

## 2020-02-16 LAB — LIPID PANEL
Chol/HDL Ratio: 4.7 ratio — ABNORMAL HIGH (ref 0.0–4.4)
Cholesterol, Total: 243 mg/dL — ABNORMAL HIGH (ref 100–199)
HDL: 52 mg/dL (ref >39)
LDL Chol Calc (NIH): 164 mg/dL — ABNORMAL HIGH (ref 0–99)
Triglycerides: 147 mg/dL (ref 0–149)
VLDL Cholesterol Cal: 27 mg/dL (ref 5–40)

## 2020-02-16 LAB — TSH: TSH: 3.14 u[IU]/mL (ref 0.450–4.500)

## 2020-04-03 ENCOUNTER — Other Ambulatory Visit: Payer: Self-pay | Admitting: Family Medicine

## 2020-04-03 DIAGNOSIS — F419 Anxiety disorder, unspecified: Secondary | ICD-10-CM

## 2020-04-03 NOTE — Telephone Encounter (Signed)
Requested medication (s) are due for refill today:  Provider to determine  Requested medication (s) are on the active medication list:   Yes  Future visit scheduled:   Yes   Last ordered: 01/27/2019 #90, 1 refill  Non delegated refill   Requested Prescriptions  Pending Prescriptions Disp Refills   ALPRAZolam (XANAX) 0.5 MG tablet 90 tablet 1    Sig: Take 1 tablet (0.5 mg total) by mouth daily as needed for anxiety.      Not Delegated - Psychiatry:  Anxiolytics/Hypnotics Failed - 04/03/2020  1:58 PM      Failed - This refill cannot be delegated      Failed - Urine Drug Screen completed in last 360 days      Passed - Valid encounter within last 6 months    Recent Outpatient Visits           2 months ago Encounter for annual physical exam   [facility] [place], Marzella Schlein, MD   12 months ago Close exposure to COVID-19 virus   [facility], Alessandra Bevels, PA-C   1 year ago Annual physical exam   [facility], Marzella Schlein, MD   1 year ago Abdominal pain, unspecified abdominal location   [facility] [place], Lavella Hammock, [place]   2 years ago Annual physical exam   [facility] [place], Alessandra Bevels, [place]       Future Appointments             In 10 months Bacigalupo, Marzella Schlein, MD [facility]

## 2020-04-03 NOTE — Telephone Encounter (Signed)
Medication:  ALPRAZolam (XANAX) 0.5 MG tablet [[id number]]   Has the patient contacted their pharmacy? no (Agent: If no, request that the patient contact the pharmacy for the refill.)   Preferred Pharmacy (with phone number or street name):  [id number] [address] [address]  [address] [place] [address]  Phone:  [phone] Fax:  [phone]  Agent: Please be advised that RX refills may take up to 3 business days. We ask that you follow-up with your pharmacy.

## 2020-04-04 ENCOUNTER — Other Ambulatory Visit: Payer: Self-pay | Admitting: Family Medicine

## 2020-04-04 DIAGNOSIS — F419 Anxiety disorder, unspecified: Secondary | ICD-10-CM

## 2020-04-04 MED ORDER — ALPRAZOLAM 0.5 MG PO TABS: 0.5000 mg | ORAL_TABLET | Freq: Every day | ORAL | 1 refills | Status: DC | PRN

## 2020-04-04 NOTE — Telephone Encounter (Signed)
Requested medication (s) are due for refill today: no  Requested medication (s) are on the active medication list: yes  Last refill: 07/15/2019  Future visit scheduled: yes  Notes to clinic: this refill cannot be delegated    Requested Prescriptions  Pending Prescriptions Disp Refills   ALPRAZolam (XANAX) 0.5 MG tablet [Pharmacy Med Name: ALPRAZOLAM 0.5 MG TABLET] 90 tablet     Sig: TAKE 1 TABLET BY MOUTH EVERY DAY AS NEEDED FOR ANXIETY      Not Delegated - Psychiatry:  Anxiolytics/Hypnotics Failed - 04/04/2020  2:28 PM      Failed - This refill cannot be delegated      Failed - Urine Drug Screen completed in last 360 days      Passed - Valid encounter within last 6 months    Recent Outpatient Visits           2 months ago Encounter for annual physical exam   [facility], Marzella Schlein, [place]   12 months ago Close exposure to COVID-19 virus   [facility] [place], Alessandra Bevels, [place]   1 year ago Annual physical exam   [facility] [place], Marzella Schlein, MD   1 year ago Abdominal pain, unspecified abdominal location   [facility] [place], Lavella Hammock, [place]   2 years ago Annual physical exam   [facility] [place], Alessandra Bevels, [place]       Future Appointments             In 10 months Bacigalupo, Marzella Schlein, MD [facility], [facility]

## 2020-08-01 ENCOUNTER — Other Ambulatory Visit: Payer: Self-pay | Admitting: Family Medicine

## 2020-10-11 ENCOUNTER — Encounter: Payer: Self-pay | Admitting: Family Medicine

## 2020-10-13 MED ORDER — PAROXETINE HCL 20 MG PO TABS
20.0000 mg | ORAL_TABLET | Freq: Every day | ORAL | 1 refills | Status: DC
Start: 2020-10-13 — End: 2021-12-03

## 2020-10-13 NOTE — Telephone Encounter (Signed)
Okay to send in Paxil 20 mg daily with 90-day supply and 1 refill.  Please update her medication list to remove the 40 mg dose.

## 2020-12-09 ENCOUNTER — Encounter: Payer: Self-pay | Admitting: Family Medicine

## 2021-01-16 ENCOUNTER — Ambulatory Visit: Payer: Self-pay | Admitting: *Deleted

## 2021-01-16 NOTE — Telephone Encounter (Signed)
Per agent: "Patient called in say that she woke up with a fever 100.8, cotton mouth and soreness in her throat. Took a Covid test that was negative but concerned and no appointment at [facility]. Please advise call at Ph# [phone] "  Pt reports sore throat, temp 100.8 this AM. Denies cough, no SOB. Onset Sunday, with worsening last night. Pt did home covid test Sunday and this AM, negative. Did have exposure last Tuesday at Bible Study at church. 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 Pt asking for work in today if possible.  CB# [phone]  Reason for Disposition . [1] Exposure to family member (or spouse or boyfriend/girlfriend) with test-proven strep AND [2] within last 10 days    School teacher, possible  Answer Assessment - Initial Assessment Questions 1. ONSET: "When did the throat start hurting?" (Hours or days ago)      Last night 2. SEVERITY: "How bad is the sore throat?" (Scale 1-10; mild, moderate or severe)   - MILD (1-3):  doesn\'t interfere with eating or normal activities   - MODERATE (4-7): interferes with eating some solids and normal activities   - SEVERE (8-10):  excruciating pain, interferes with most normal activities   - SEVERE DYSPHAGIA: can\'t swallow liquids, drooling     5/10 3. STREP EXPOSURE: "Has there been any exposure to strep within the past week?" If Yes, ask: "What type of contact occurred?"      no 4.  VIRAL SYMPTOMS: "Are there any symptoms of a cold, such as a runny nose, cough, hoarse voice or red eyes?"      no 5. FEVER: "Do you have a fever?" If Yes, ask: "What is your temperature, how was it measured, and when did it start?"     10 0.8 this AM 6. PUS ON THE TONSILS: "Is there pus on the tonsils in the back of your throat?"     unsure 7. OTHER SYMPTOMS: "Do you have any other symptoms?" (e.g., difficulty breathing, headache, rash)     no  Protocols used: Sore  Throat-A-AH

## 2021-02-05 ENCOUNTER — Other Ambulatory Visit: Payer: Self-pay

## 2021-02-05 ENCOUNTER — Ambulatory Visit (INDEPENDENT_AMBULATORY_CARE_PROVIDER_SITE_OTHER): Payer: BC Managed Care – PPO | Admitting: Family Medicine

## 2021-02-05 ENCOUNTER — Encounter: Payer: Self-pay | Admitting: Family Medicine

## 2021-02-05 VITALS — BP 116/80 | HR 68 | Temp 97.9°F | Ht 62.0 in | Wt 132.2 lb

## 2021-02-05 DIAGNOSIS — E038 Other specified hypothyroidism: Secondary | ICD-10-CM

## 2021-02-05 DIAGNOSIS — E7800 Pure hypercholesterolemia, unspecified: Secondary | ICD-10-CM | POA: Diagnosis not present

## 2021-02-05 DIAGNOSIS — Z1231 Encounter for screening mammogram for malignant neoplasm of breast: Secondary | ICD-10-CM

## 2021-02-05 DIAGNOSIS — F411 Generalized anxiety disorder: Secondary | ICD-10-CM | POA: Diagnosis not present

## 2021-02-05 DIAGNOSIS — Z0000 Encounter for general adult medical examination without abnormal findings: Secondary | ICD-10-CM | POA: Diagnosis not present

## 2021-02-05 NOTE — Progress Notes (Signed)
Complete physical exam   Patient: Pamela Taylor   DOB: March 04, 1966   55 y.o. Female  MRN: [id number] Visit Date: 02/05/2021  Today's healthcare provider: Shirlee Latch, MD   Chief Complaint  Patient presents with   Annual Exam   Subjective    Pamela Taylor is a 55 y.o. female who presents today for a complete physical exam.  She reports consuming a general diet. Home exercise routine includes walking 1 hrs per day 5 days a week. She generally feels well. She reports sleeping well. She does not have additional problems to discuss today.  Patient reports receiving the flu vaccine at work. She does not have any concerns at this time. Discussed scheduling mammogram for screening every year and patient is agreeable with this plan. Will repeat yearly labs along with physical as well.    Past Medical History:  Diagnosis Date   Anemia 05/17/2009   Depression    Hyperlipidemia    Past Surgical History:  Procedure Laterality Date   BTL  1993   CESAREAN SECTION  [phone]   EYE SURGERY     HAND SURGERY Right    TRIGGER FINGER   IR RADIOLOGIST EVAL & MGMT  05/26/2018   IR RADIOLOGIST EVAL & MGMT  06/18/2018   LAPAROSCOPIC APPENDECTOMY N/A 07/06/2018   Procedure: APPENDECTOMY LAPAROSCOPIC;  Surgeon: Carolan Shiver, MD;  Location: [facility];  Service: General;  Laterality: N/A;   LAPAROSCOPY N/A 10/30/2018   Procedure: Laparoscopic drainage of intraabdominal abscess;  Surgeon: Carolan Shiver, MD;  Location: [facility];  Service: General;  Laterality: N/A;   REFRACTIVE SURGERY Bilateral 2003   Social History   Socioeconomic History   Marital status: Married    Spouse name: Not on file   Number of children: Not on file   Years of education: Not on file   Highest education level: Not on file  Occupational History   Not on file  Tobacco Use   Smoking status: Never   Smokeless tobacco: Never  Vaping Use   Vaping Use: Never used  Substance and Sexual  Activity   Alcohol use: Yes    Comment: Occasional    Drug use: No   Sexual activity: Not on file  Other Topics Concern   Not on file  Social History Narrative   Not on file   Social Determinants of Health   Financial Resource Strain: Not on file  Food Insecurity: Not on file  Transportation Needs: Not on file  Physical Activity: Not on file  Stress: Not on file  Social Connections: Not on file  Intimate Partner Violence: Not on file   Family Status  Relation Name Status   Father  (Not Specified)       substance abuse, alcoholism in revocery. Early Dementia, CHF. No previous AMI; emphysema   Sister  Nature conservation officer   Mother  (Not Specified)   Family History  Problem Relation Age of Onset   Depression Father    Cancer Mother 47       Breast cancer   Breast cancer Mother 75   No Known Allergies  Patient Care Team: Erasmo Downer, MD as PCP - General (Family Medicine)   Medications: Outpatient Medications Prior to Visit  Medication Sig   ALPRAZolam (XANAX) 0.5 MG tablet TAKE 1 TABLET BY MOUTH EVERY DAY AS NEEDED FOR ANXIETY   PARoxetine (PAXIL) 20 MG tablet Take 1 tablet (20 mg total) by mouth daily.   valACYclovir (  VALTREX) 1000 MG tablet TAKE 1 TABLET (1,000 MG TOTAL) BY MOUTH DAILY AS NEEDED (COLD SORES).   [DISCONTINUED] ALPRAZolam (XANAX) 0.5 MG tablet Take 1 tablet (0.5 mg total) by mouth daily as needed for anxiety.   No facility-administered medications prior to visit.    Review of Systems  Constitutional: Negative.   Eyes: Negative.   Respiratory: Negative.    Cardiovascular: Negative.   Gastrointestinal:  Negative for abdominal pain.  All other systems reviewed and are negative.    Objective    BP 116/80 (BP Location: Left Arm, Patient Position: Sitting, Cuff Size: Normal)   Pulse 68   Temp 97.9 F (36.6 C) (Oral)   Ht 5\' 2"  (1.575 m)   Wt 132 lb 3.2 oz (60 kg)   LMP 11/14/2015   SpO2 100%   BMI 24.18 kg/m    Physical  Exam Vitals reviewed.  Constitutional:      General: She is not in acute distress.    Appearance: Normal appearance. She is not ill-appearing or toxic-appearing.  HENT:     Head: Normocephalic and atraumatic.     Right Ear: Tympanic membrane, ear canal and external ear normal.     Left Ear: Tympanic membrane, ear canal and external ear normal.     Nose: Nose normal.     Mouth/Throat:     Mouth: Mucous membranes are moist.     Pharynx: Oropharynx is clear. No oropharyngeal exudate or posterior oropharyngeal erythema.  Eyes:     General: No scleral icterus.    Extraocular Movements: Extraocular movements intact.     Conjunctiva/sclera: Conjunctivae normal.     Pupils: Pupils are equal, round, and reactive to light.  Cardiovascular:     Rate and Rhythm: Normal rate and regular rhythm.     Pulses: Normal pulses.     Heart sounds: Normal heart sounds. No murmur heard.   No friction rub. No gallop.  Pulmonary:     Effort: Pulmonary effort is normal. No respiratory distress.     Breath sounds: Normal breath sounds. No wheezing or rhonchi.  Chest:     Chest wall: No tenderness.  Abdominal:     General: Abdomen is flat. Bowel sounds are normal. There is no distension.     Palpations: Abdomen is soft.     Tenderness: There is no abdominal tenderness.  Musculoskeletal:        General: Normal range of motion.     Cervical back: Normal range of motion and neck supple.     Right lower leg: No edema.     Left lower leg: No edema.  Skin:    General: Skin is warm and dry.     Capillary Refill: Capillary refill takes less than 2 seconds.     Findings: No lesion or rash.  Neurological:     General: No focal deficit present.     Mental Status: She is alert and oriented to person, place, and time. Mental status is at baseline.  Psychiatric:        Mood and Affect: Mood normal.     Last depression screening scores PHQ 2/9 Scores 02/05/2021 01/31/2020 01/27/2019  PHQ - 2 Score 0 1 1  PHQ-  9 Score 1 2 2    Last fall risk screening Fall Risk  02/05/2021  Falls in the past year? 0  Number falls in past yr: 0  Injury with Fall? 0  Risk for fall due to : No Fall Risks   Last Audit-C  alcohol use screening Alcohol Use Disorder Test (AUDIT) 02/05/2021  1. How often do you have a drink containing alcohol? 2  2. How many drinks containing alcohol do you have on a typical day when you are drinking? 0  3. How often do you have six or more drinks on one occasion? 0  AUDIT-C Score 2  Alcohol Brief Interventions/Follow-up -   A score of 3 or more in women, and 4 or more in men indicates increased risk for alcohol abuse, EXCEPT if all of the points are from question 1   No results found for any visits on 02/05/21.  Assessment & Plan    Routine Health Maintenance and Physical Exam  Exercise Activities and Dietary recommendations  Goals   None     Immunization History  Administered Date(s) Administered   Influenza Inj Mdck Quad Pf 02/02/2018   Influenza Split 04/27/2009   Influenza,inj,Quad PF,6+ Mos 01/06/2014, 01/05/2016, 02/02/2018, 01/27/2019, 01/10/2020   Influenza,inj,Quad PF,6-35 Mos 01/15/2021   Influenza-Unspecified 02/13/2017, 12/23/2018, 01/10/2020   PFIZER(Purple Top)SARS-COV-2 Vaccination 01/10/2020   Tdap 11/18/2017   Zoster Recombinat (Shingrix) 12/11/2016, 01/30/2018    Health Maintenance  Topic Date Due   Pneumococcal Vaccine 18-14 Years old (1 - PCV) Never done   COVID-19 Vaccine (2 - Pfizer risk series) 01/31/2020   MAMMOGRAM  01/30/2022   PAP SMEAR-Modifier  11/19/2022   COLONOSCOPY (Pts 45-62yrs Insurance coverage will need to be confirmed)  09/14/2023   TETANUS/TDAP  11/19/2027   INFLUENZA VACCINE  Completed   Hepatitis C Screening  Completed   HIV Screening  Completed   Zoster Vaccines- Shingrix  Completed   HPV VACCINES  Aged Out    Discussed health benefits of physical activity, and encouraged her to engage in regular exercise appropriate  for her age and condition.  Problem List Items Addressed This Visit       Endocrine   Subclinical hypothyroidism    History of elevated TSH Currently asymptomatic, not on medication      Relevant Orders   TSH + free T4     Other   GAD (generalized anxiety disorder)   Hypercholesterolemia without hypertriglyceridemia   Relevant Orders   Lipid panel   Comprehensive metabolic panel   Other Visit Diagnoses     Encounter for annual physical exam    -  Primary   Relevant Orders   TSH + free T4   Lipid panel   Comprehensive metabolic panel   Screening mammogram for breast cancer       Relevant Orders   MM 3D SCREEN BREAST BILATERAL        Return in about 6 months (around 08/06/2021) for chronic disease f/u, virtual ok.    Gwyndolyn Kaufman, Medical Student 02/05/2021, 3:54 PM  Patient seen along with MS3 student Gwyndolyn Kaufman. I personally evaluated this patient along with the student, and verified all aspects of the history, physical exam, and medical decision making as documented by the student. I agree with the student's documentation and have made all necessary edits.  Laya Letendre, Marzella Schlein, MD, MPH [facility]

## 2021-02-05 NOTE — Assessment & Plan Note (Signed)
History of elevated TSH Currently asymptomatic, not on medication

## 2021-02-12 ENCOUNTER — Encounter: Payer: Self-pay | Admitting: Family Medicine

## 2021-02-12 ENCOUNTER — Other Ambulatory Visit: Payer: Self-pay

## 2021-02-12 DIAGNOSIS — F419 Anxiety disorder, unspecified: Secondary | ICD-10-CM

## 2021-02-12 MED ORDER — ALPRAZOLAM 0.5 MG PO TABS: ORAL_TABLET | ORAL | 1 refills | Status: DC

## 2021-02-13 LAB — TSH+FREE T4
Free T4: 1.17 ng/dL (ref 0.82–1.77)
TSH: 3.37 u[IU]/mL (ref 0.450–4.500)

## 2021-02-13 LAB — COMPREHENSIVE METABOLIC PANEL
ALT: 17 IU/L (ref 0–32)
AST: 22 IU/L (ref 0–40)
Albumin/Globulin Ratio: 1.8 (ref 1.2–2.2)
Albumin: 4.4 g/dL (ref 3.8–4.9)
Alkaline Phosphatase: 67 IU/L (ref 44–121)
BUN/Creatinine Ratio: 24 — ABNORMAL HIGH (ref 9–23)
BUN: 15 mg/dL (ref 6–24)
Bilirubin Total: 0.2 mg/dL (ref 0.0–1.2)
CO2: 18 mmol/L — ABNORMAL LOW (ref 20–29)
Calcium: 9.7 mg/dL (ref 8.7–10.2)
Chloride: 103 mmol/L (ref 96–106)
Creatinine, Ser: 0.63 mg/dL (ref 0.57–1.00)
Globulin, Total: 2.5 g/dL (ref 1.5–4.5)
Glucose: 89 mg/dL (ref 70–99)
Potassium: 4.1 mmol/L (ref 3.5–5.2)
Sodium: 139 mmol/L (ref 134–144)
Total Protein: 6.9 g/dL (ref 6.0–8.5)
eGFR: 103 mL/min/{1.73_m2} (ref >59)

## 2021-02-13 LAB — LIPID PANEL
Chol/HDL Ratio: 3.8 ratio (ref 0.0–4.4)
Cholesterol, Total: 238 mg/dL — ABNORMAL HIGH (ref 100–199)
HDL: 62 mg/dL (ref >39)
LDL Chol Calc (NIH): 156 mg/dL — ABNORMAL HIGH (ref 0–99)
Triglycerides: 112 mg/dL (ref 0–149)
VLDL Cholesterol Cal: 20 mg/dL (ref 5–40)

## 2021-02-16 ENCOUNTER — Encounter: Payer: Self-pay | Admitting: Family Medicine

## 2021-06-21 ENCOUNTER — Ambulatory Visit: Payer: BC Managed Care – PPO | Admitting: Family Medicine

## 2021-06-21 ENCOUNTER — Other Ambulatory Visit: Payer: Self-pay

## 2021-06-21 ENCOUNTER — Encounter: Payer: Self-pay | Admitting: Family Medicine

## 2021-06-21 VITALS — BP 114/80 | HR 75 | Temp 97.7°F | Resp 16 | Wt 134.6 lb

## 2021-06-21 DIAGNOSIS — J0140 Acute pansinusitis, unspecified: Secondary | ICD-10-CM | POA: Diagnosis not present

## 2021-06-21 DIAGNOSIS — F411 Generalized anxiety disorder: Secondary | ICD-10-CM

## 2021-06-21 DIAGNOSIS — F419 Anxiety disorder, unspecified: Secondary | ICD-10-CM | POA: Diagnosis not present

## 2021-06-21 MED ORDER — AMOXICILLIN-POT CLAVULANATE 875-125 MG PO TABS: 1.0000 | ORAL_TABLET | Freq: Two times a day (BID) | ORAL | 0 refills | Status: AC

## 2021-06-21 MED ORDER — ALPRAZOLAM 0.5 MG PO TABS: ORAL_TABLET | ORAL | 1 refills | Status: DC

## 2021-06-21 NOTE — Progress Notes (Signed)
?  ? ?I,Sulibeya S Dimas,acting as a scribe for Shirlee Latch, MD.,have documented all relevant documentation on the behalf of Shirlee Latch, MD,as directed by  Shirlee Latch, MD while in the presence of Shirlee Latch, MD. ? ? ?Established patient visit ? ? ?Patient: Pamela Taylor   DOB: January 10, 1966   56 y.o. Female  MRN: [id number] ?Visit Date: 06/21/2021 ? ?Today's healthcare provider: Shirlee Latch, MD  ? ?Chief Complaint  ?Patient presents with  ? URI  ? ?Subjective  ?  ?HPI  ?Upper respiratory symptoms ?She complains of low grade fever and productive cough with  yellow colored sputum.with no fever, chills, night sweats or weight loss. Onset of symptoms was about a week ago and gradually worsening.She is drinking plenty of fluids.  Past history is significant for no history of pneumonia or bronchitis. Patient is non-smoker. Patient using DayQuil and NyQuil reports moderate symptom control. ? ?Known sick contacts of grandchildren ? ?X10d ?Home COVID test negative ?Deep breath  triggers the cough ?Tmax 99.75F ?------------------------------------------------------------------------- ? ? ?Medications: ?Outpatient Medications Prior to Visit  ?Medication Sig  ? PARoxetine (PAXIL) 20 MG tablet Take 1 tablet (20 mg total) by mouth daily.  ? valACYclovir (VALTREX) 1000 MG tablet TAKE 1 TABLET (1,000 MG TOTAL) BY MOUTH DAILY AS NEEDED (COLD SORES).  ? [DISCONTINUED] ALPRAZolam (XANAX) 0.5 MG tablet TAKE 1 TABLET BY MOUTH EVERY DAY AS NEEDED FOR ANXIETY  ? ?No facility-administered medications prior to visit.  ? ? ?Review of Systems  ?Constitutional:  Positive for fatigue.  ?HENT:  Negative for congestion, ear pain and sore throat.   ?Respiratory:  Positive for cough, chest tightness, shortness of breath and wheezing.   ?Cardiovascular:  Negative for chest pain.  ? ?  ?  Objective  ?  ?BP 114/80 (BP Location: Left Arm, Patient Position: Sitting, Cuff Size: Large)   Pulse 75   Temp 97.7 ?F (36.5 ?C) (Oral)   Resp 16   Wt 134 lb 9.6 oz (61.1 kg)   LMP 11/14/2015   SpO2 95%   BMI 24.62 kg/m?  ?BP Readings from Last 3 Encounters:  ?06/21/21 114/80  ?02/05/21 116/80  ?01/31/20 119/80  ? ?Wt Readings from Last 3 Encounters:  ?06/21/21 134 lb 9.6 oz (61.1 kg)  ?02/05/21 132 lb 3.2 oz (60 kg)  ?01/31/20 145 lb 6.4 oz (66 kg)  ? ?  ? ?Physical Exam ?Vitals reviewed.  ?Constitutional:   ?   General: She is not in acute distress. ?   Appearance: Normal appearance. She is well-developed. She is not diaphoretic.  ?HENT:  ?   Head: Normocephalic and atraumatic.  ?   Right Ear: Tympanic membrane, ear canal and external ear normal.  ?   Left Ear: Tympanic membrane, ear canal and external ear normal.  ?   Nose: Congestion present.  ?   Mouth/Throat:  ?   Mouth: Mucous membranes are moist.  ?   Pharynx: Oropharynx is clear. No oropharyngeal exudate.  ?Eyes:  ?   General: No scleral icterus. ?   Conjunctiva/sclera: Conjunctivae normal.  ?Neck:  ?   Thyroid: No thyromegaly.  ?Cardiovascular:  ?   Rate and  Rhythm: Normal rate and regular rhythm.  ?   Pulses: Normal pulses.  ?   Heart sounds: Normal heart sounds. No murmur heard. ?Pulmonary:  ?   Effort: Pulmonary effort is normal. No respiratory distress.  ?   Breath sounds: Normal breath sounds. No wheezing, rhonchi or rales.  ?Musculoskeletal:  ?  Cervical back: Neck supple.  ?   Right lower leg: No edema.  ?   Left lower leg: No edema.  ?Lymphadenopathy:  ?   Cervical: No cervical adenopathy.  ?Skin: ?   General: Skin is warm and dry.  ?   Findings: No rash.  ?Neurological:  ?   Mental Status: She is alert and oriented to person, place, and time. Mental status is at baseline.  ?Psychiatric:     ?   Mood and Affect: Mood normal.     ?   Behavior: Behavior normal.  ?  ? ? ?No results found for any visits on 06/21/21. ? Assessment & Plan  ?  ? ?1. Acute non-recurrent pansinusitis ?- symptoms and exam c/w sinusitis   ?- no evidence of AOM, CAP, strep pharyngitis, or other infection ?- given duration of symptoms, suspect bacterial etiology ?- will treat with augmentin x7d ?- discussed symptomatic management (flonase, decongestants, etc), natural course, and return precautions   ? ?2. Acute anxiety ?3. GAD (generalized anxiety disorder) ?- chronic and well controlled ?-continue paxil and low dose xanax sparingly - refilled today ?- ALPRAZolam (XANAX) 0.5 MG tablet; TAKE 1 TABLET BY MOUTH EVERY DAY AS NEEDED FOR ANXIETY  Dispense: 90 tablet; Refill: 1 ? ? ?Return in about 7 months (around 01/21/2022) for CPE.  ?   ? ?I, Shirlee Latch, MD, have reviewed all documentation for this visit. The documentation on 06/21/21 for the exam, diagnosis, procedures, and orders are all accurate and complete. ? ? ?Erasmo Downer, MD, MPH ?[facility] ?[facility]   ?

## 2021-08-06 ENCOUNTER — Telehealth: Payer: BC Managed Care – PPO | Admitting: Family Medicine

## 2021-12-01 ENCOUNTER — Other Ambulatory Visit: Payer: Self-pay | Admitting: Family Medicine

## 2022-02-08 NOTE — Progress Notes (Unsigned)
I,Sulibeya S Dimas,acting as a Education administrator for Lavon Paganini, MD.,have documented all relevant documentation on the behalf of Lavon Paganini, MD,as directed by  Lavon Paganini, MD while in the presence of Lavon Paganini, MD.    Complete physical exam   Patient: Pamela Taylor   DOB: 12-05-1965   56 y.o. Female  MRN: [id number] Visit Date: 02/11/2022  Today's healthcare provider: Lavon Paganini, MD   Chief Complaint  Patient presents with   Annual Exam   Subjective    Pamela Taylor is a 56 y.o. female who presents today for a complete physical exam.  She reports consuming a general diet.  walking  She generally feels well. She reports sleeping well. She does not have additional problems to discuss today.  HPI  Pap-Due 12/07/2022  Past Medical History:  Diagnosis Date   Anemia 05/17/2009   Depression    Hyperlipidemia    Past Surgical History:  Procedure Laterality Date   BTL  1993   CESAREAN SECTION  [phone]   EYE SURGERY     HAND SURGERY Right    TRIGGER FINGER   IR RADIOLOGIST EVAL & MGMT  05/26/2018   IR RADIOLOGIST EVAL & MGMT  06/18/2018   LAPAROSCOPIC APPENDECTOMY N/A 07/06/2018   Procedure: APPENDECTOMY LAPAROSCOPIC;  Surgeon: Herbert Pun, MD;  Location: [facility] ORS;  Service: General;  Laterality: N/A;   LAPAROSCOPY N/A 10/30/2018   Procedure: Laparoscopic drainage of intraabdominal abscess;  Surgeon: Herbert Pun, MD;  Location: [facility];  Service: General;  Laterality: N/A;   REFRACTIVE SURGERY Bilateral 2003   Social History   Socioeconomic History   Marital status: Married    Spouse name: Not on file   Number of children: Not on file   Years of education: Not on file   Highest education level: Not on file  Occupational History   Not on file  Tobacco Use   Smoking status: Never   Smokeless tobacco: Never  Vaping Use   Vaping Use: Never used  Substance and Sexual Activity   Alcohol use: Yes    Comment:  Occasional    Drug use: No   Sexual activity: Not on file  Other Topics Concern   Not on file  Social History Narrative   Not on file   Social Determinants of Health   Financial Resource Strain: Not on file  Food Insecurity: Not on file  Transportation Needs: Not on file  Physical Activity: Not on file  Stress: Not on file  Social Connections: Not on file  Intimate Partner Violence: Not on file   Family Status  Relation Name Status   Father  (Not Specified)       substance abuse, alcoholism in revocery. Early Dementia, CHF. No previous AMI; emphysema   Sister  Insurance risk surveyor   Mother  (Not Specified)   Family History  Problem Relation Age of Onset   Depression Father    Cancer Mother 67       Breast cancer   Breast cancer Mother 33   No Known Allergies  Patient Care Team: Virginia Crews, MD as PCP - General (Family Medicine)   Medications: Outpatient Medications Prior to Visit  Medication Sig   meclizine (ANTIVERT) 25 MG tablet Take 25 mg by mouth as needed.   valACYclovir (VALTREX) 1000 MG tablet TAKE 1 TABLET (1,000 MG TOTAL) BY MOUTH DAILY AS NEEDED (COLD SORES).   [DISCONTINUED] ALPRAZolam (XANAX) 0.5 MG tablet TAKE 1 TABLET BY MOUTH EVERY  DAY AS NEEDED FOR ANXIETY   [DISCONTINUED] PARoxetine (PAXIL) 20 MG tablet TAKE 1 TABLET BY MOUTH EVERY DAY   No facility-administered medications prior to visit.    Review of Systems  All other systems reviewed and are negative.   Last CBC Lab Results  Component Value Date   WBC 5.6 02/15/2020   HGB 13.4 02/15/2020   HCT 39.7 02/15/2020   MCV 91 02/15/2020   MCH 30.8 02/15/2020   RDW 13.2 02/15/2020   PLT 271 47/65/4650   Last metabolic panel Lab Results  Component Value Date   GLUCOSE 89 02/12/2021   NA 139 02/12/2021   K 4.1 02/12/2021   CL 103 02/12/2021   CO2 18 (L) 02/12/2021   BUN 15 02/12/2021   CREATININE 0.63 02/12/2021   EGFR 103 02/12/2021   CALCIUM 9.7 02/12/2021   PROT 6.9  02/12/2021   ALBUMIN 4.4 02/12/2021   LABGLOB 2.5 02/12/2021   AGRATIO 1.8 02/12/2021   BILITOT 0.2 02/12/2021   ALKPHOS 67 02/12/2021   AST 22 02/12/2021   ALT 17 02/12/2021   ANIONGAP 9 10/27/2018   Last lipids Lab Results  Component Value Date   CHOL 238 (H) 02/12/2021   HDL 62 02/12/2021   LDLCALC 156 (H) 02/12/2021   TRIG 112 02/12/2021   CHOLHDL 3.8 02/12/2021   Last hemoglobin A1c Lab Results  Component Value Date   HGBA1C 5.0 11/18/2017   Last thyroid functions Lab Results  Component Value Date   TSH 3.370 02/12/2021   T4TOTAL 6.2 02/16/2019      Objective    BP 109/74 (BP Location: Left Arm, Patient Position: Sitting, Cuff Size: Large)   Pulse 68   Temp 98 F (36.7 C) (Oral)   Resp 16   Ht _0  (1.575 m)   Wt 135 lb 14.4 oz (61.6 kg)   LMP 11/14/2015   BMI 24.86 kg/m  BP Readings from Last 3 Encounters:  02/11/22 109/74  06/21/21 114/80  02/05/21 116/80   Wt Readings from Last 3 Encounters:  02/11/22 135 lb 14.4 oz (61.6 kg)  06/21/21 134 lb 9.6 oz (61.1 kg)  02/05/21 132 lb 3.2 oz (60 kg)       Physical Exam Vitals reviewed.  Constitutional:      General: She is not in acute distress.    Appearance: Normal appearance. She is well-developed. She is not diaphoretic.  HENT:     Head: Normocephalic and atraumatic.     Right Ear: Tympanic membrane, ear canal and external ear normal.     Left Ear: Tympanic membrane, ear canal and external ear normal.     Nose: Nose normal.     Mouth/Throat:     Mouth: Mucous membranes are moist.     Pharynx: Oropharynx is clear. No oropharyngeal exudate.  Eyes:     General: No scleral icterus.    Conjunctiva/sclera: Conjunctivae normal.     Pupils: Pupils are equal, round, and reactive to light.  Neck:     Thyroid: No thyromegaly.  Cardiovascular:     Rate and Rhythm: Normal rate and regular rhythm.     Pulses: Normal pulses.     Heart sounds: Normal heart sounds. No murmur heard. Pulmonary:      Effort: Pulmonary effort is normal. No respiratory distress.     Breath sounds: Normal breath sounds. No wheezing or rales.  Abdominal:     General: There is no distension.     Palpations: Abdomen is soft.  Tenderness: There is no abdominal tenderness.  Musculoskeletal:        General: No deformity.     Cervical back: Neck supple.     Right lower leg: No edema.     Left lower leg: No edema.  Lymphadenopathy:     Cervical: No cervical adenopathy.  Skin:    General: Skin is warm and dry.     Findings: No rash.  Neurological:     Mental Status: She is alert and oriented to person, place, and time. Mental status is at baseline.     Gait: Gait normal.  Psychiatric:        Mood and Affect: Mood normal.        Behavior: Behavior normal.        Thought Content: Thought content normal.       Last depression screening scores    02/11/2022    3:56 PM 06/21/2021   11:12 AM 02/05/2021    3:07 PM  PHQ 2/9 Scores  PHQ - 2 Score 0 0 0  PHQ- 9 Score 0 2 1   Last fall risk screening    02/11/2022    3:56 PM  [place] in the past year? 0  Number falls in past yr: 0  Injury with Fall? 0  Risk for fall due to : No Fall Risks  Follow up Falls evaluation completed   Last Audit-C alcohol use screening    02/11/2022    3:57 PM  Alcohol Use Disorder Test (AUDIT)  1. How often do you have a drink containing alcohol? 2  2. How many drinks containing alcohol do you have on a typical day when you are drinking? 0  3. How often do you have six or more drinks on one occasion? 0  AUDIT-C Score 2   A score of 3 or more in women, and 4 or more in men indicates increased risk for alcohol abuse, EXCEPT if all of the points are from question 1   No results found for any visits on 02/11/22.  Assessment & Plan    Routine Health Maintenance and Physical Exam  Exercise Activities and Dietary recommendations  Goals   None     Immunization History  Administered Date(s)  Administered   Influenza Inj Mdck Quad Pf 02/02/2018   Influenza Split 04/27/2009   Influenza,inj,Quad PF,6+ Mos 01/06/2014, 01/05/2016, 02/02/2018, 01/27/2019, 01/10/2020, 01/22/2022   Influenza,inj,Quad PF,6-35 Mos 01/15/2021   Influenza-Unspecified 02/13/2017, 12/23/2018, 01/10/2020   PFIZER(Purple Top)SARS-COV-2 Vaccination 01/10/2020   Pfizer Covid-19 Vaccine Bivalent Booster 58yr & up 02/12/2021   Tdap 11/18/2017   Zoster Recombinat (Shingrix) 12/11/2016, 01/30/2018    Health Maintenance  Topic Date Due   COVID-19 Vaccine (3 - Pfizer risk series) 03/12/2021   MAMMOGRAM  01/30/2022   PAP SMEAR-Modifier  11/19/2022   COLONOSCOPY (Pts 45-466yrInsurance coverage will need to be confirmed)  09/14/2023   TETANUS/TDAP  11/19/2027   INFLUENZA VACCINE  Completed   Hepatitis C Screening  Completed   HIV Screening  Completed   Zoster Vaccines- Shingrix  Completed   HPV VACCINES  Aged Out    Discussed health benefits of physical activity, and encouraged her to engage in regular exercise appropriate for her age and condition.  Problem List Items Addressed This Visit       Endocrine   Subclinical hypothyroidism    History of elevated TSH Currently asymptomatic Not on medications Recheck TSH and free T4  Relevant Orders   TSH + free T4     Other   GAD (generalized anxiety disorder)    Chronic, controlled Continue Paxil at current dose Continue low-dose Xanax as needed      Relevant Medications   PARoxetine (PAXIL) 20 MG tablet   ALPRAZolam (XANAX) 0.5 MG tablet   Clinical depression    Dysthymia Chronic and well-controlled Continue Paxil at current dose Discussed therapy and synergistic effect with medication previously No SI/HI      Relevant Medications   PARoxetine (PAXIL) 20 MG tablet   ALPRAZolam (XANAX) 0.5 MG tablet   Hypercholesterolemia without hypertriglyceridemia    Reviewed last lipid panel Not currently on a statin Recheck FLP and  CMP Discussed diet and exercise       Relevant Orders   Comprehensive metabolic panel   Lipid panel   Other Visit Diagnoses     Encounter for annual physical exam    -  Primary   Relevant Orders   Comprehensive metabolic panel   Lipid panel   TSH + free T4   Screening mammogram for breast cancer       Relevant Orders   MM 3D SCREEN BREAST BILATERAL   Acute anxiety       Relevant Medications   PARoxetine (PAXIL) 20 MG tablet   ALPRAZolam (XANAX) 0.5 MG tablet        Return in about 1 year (around 02/12/2023) for CPE.     I, Lavon Paganini, MD, have reviewed all documentation for this visit. The documentation on 02/11/22 for the exam, diagnosis, procedures, and orders are all accurate and complete.   Bacigalupo, Dionne Bucy, MD, MPH [facility]

## 2022-02-11 ENCOUNTER — Encounter: Payer: Self-pay | Admitting: Family Medicine

## 2022-02-11 ENCOUNTER — Ambulatory Visit (INDEPENDENT_AMBULATORY_CARE_PROVIDER_SITE_OTHER): Payer: BC Managed Care – PPO | Admitting: Family Medicine

## 2022-02-11 VITALS — BP 109/74 | HR 68 | Temp 98.0°F | Resp 16 | Ht 62.0 in | Wt 135.9 lb

## 2022-02-11 DIAGNOSIS — E7800 Pure hypercholesterolemia, unspecified: Secondary | ICD-10-CM | POA: Diagnosis not present

## 2022-02-11 DIAGNOSIS — F419 Anxiety disorder, unspecified: Secondary | ICD-10-CM

## 2022-02-11 DIAGNOSIS — F411 Generalized anxiety disorder: Secondary | ICD-10-CM

## 2022-02-11 DIAGNOSIS — Z1231 Encounter for screening mammogram for malignant neoplasm of breast: Secondary | ICD-10-CM

## 2022-02-11 DIAGNOSIS — E038 Other specified hypothyroidism: Secondary | ICD-10-CM | POA: Diagnosis not present

## 2022-02-11 DIAGNOSIS — Z0000 Encounter for general adult medical examination without abnormal findings: Secondary | ICD-10-CM

## 2022-02-11 DIAGNOSIS — F341 Dysthymic disorder: Secondary | ICD-10-CM

## 2022-02-11 MED ORDER — PAROXETINE HCL 20 MG PO TABS: 20.0000 mg | ORAL_TABLET | Freq: Every day | ORAL | 3 refills | Status: DC

## 2022-02-11 MED ORDER — ALPRAZOLAM 0.5 MG PO TABS: ORAL_TABLET | ORAL | 1 refills | Status: DC

## 2022-02-11 NOTE — Assessment & Plan Note (Signed)
Chronic, controlled Continue Paxil at current dose Continue low-dose Xanax as needed

## 2022-02-11 NOTE — Assessment & Plan Note (Signed)
History of elevated TSH Currently asymptomatic Not on medications Recheck TSH and free T4

## 2022-02-11 NOTE — Assessment & Plan Note (Signed)
Reviewed last lipid panel Not currently on a statin Recheck FLP and CMP Discussed diet and exercise  

## 2022-02-11 NOTE — Assessment & Plan Note (Addendum)
Dysthymia Chronic and well-controlled Continue Paxil at current dose Discussed therapy and synergistic effect with medication previously No SI/HI

## 2022-02-12 LAB — COMPREHENSIVE METABOLIC PANEL
ALT: 16 IU/L (ref 0–32)
AST: 27 IU/L (ref 0–40)
Albumin/Globulin Ratio: 1.7 (ref 1.2–2.2)
Albumin: 4.3 g/dL (ref 3.8–4.9)
Alkaline Phosphatase: 56 IU/L (ref 44–121)
BUN/Creatinine Ratio: 15 (ref 9–23)
BUN: 11 mg/dL (ref 6–24)
Bilirubin Total: 0.2 mg/dL (ref 0.0–1.2)
CO2: 23 mmol/L (ref 20–29)
Calcium: 9.1 mg/dL (ref 8.7–10.2)
Chloride: 103 mmol/L (ref 96–106)
Creatinine, Ser: 0.71 mg/dL (ref 0.57–1.00)
Globulin, Total: 2.5 g/dL (ref 1.5–4.5)
Glucose: 84 mg/dL (ref 70–99)
Potassium: 3.9 mmol/L (ref 3.5–5.2)
Sodium: 139 mmol/L (ref 134–144)
Total Protein: 6.8 g/dL (ref 6.0–8.5)
eGFR: 98 mL/min/{1.73_m2} (ref >59)

## 2022-02-12 LAB — LIPID PANEL
Chol/HDL Ratio: 3.4 ratio (ref 0.0–4.4)
Cholesterol, Total: 217 mg/dL — ABNORMAL HIGH (ref 100–199)
HDL: 63 mg/dL (ref >39)
LDL Chol Calc (NIH): 138 mg/dL — ABNORMAL HIGH (ref 0–99)
Triglycerides: 91 mg/dL (ref 0–149)
VLDL Cholesterol Cal: 16 mg/dL (ref 5–40)

## 2022-02-12 LAB — TSH+FREE T4
Free T4: 1.13 ng/dL (ref 0.82–1.77)
TSH: 3.04 u[IU]/mL (ref 0.450–4.500)

## 2022-05-27 ENCOUNTER — Encounter: Payer: Self-pay | Admitting: Family Medicine

## 2022-05-27 DIAGNOSIS — F419 Anxiety disorder, unspecified: Secondary | ICD-10-CM

## 2022-05-28 MED ORDER — ALPRAZOLAM 0.5 MG PO TABS: ORAL_TABLET | ORAL | 1 refills | Status: DC

## 2022-05-28 MED ORDER — PAROXETINE HCL 40 MG PO TABS: 40.0000 mg | ORAL_TABLET | Freq: Every day | ORAL | 1 refills | Status: DC

## 2022-05-30 ENCOUNTER — Encounter: Payer: Self-pay | Admitting: Family Medicine

## 2022-05-30 ENCOUNTER — Ambulatory Visit: Payer: BC Managed Care – PPO | Admitting: Family Medicine

## 2022-05-30 VITALS — BP 112/76 | HR 78 | Temp 98.1°F | Wt 138.5 lb

## 2022-05-30 DIAGNOSIS — J020 Streptococcal pharyngitis: Secondary | ICD-10-CM | POA: Insufficient documentation

## 2022-05-30 DIAGNOSIS — J029 Acute pharyngitis, unspecified: Secondary | ICD-10-CM | POA: Diagnosis not present

## 2022-05-30 LAB — POC COVID19 BINAXNOW: SARS Coronavirus 2 Ag: NEGATIVE

## 2022-05-30 LAB — POCT RAPID STREP A (OFFICE): Rapid Strep A Screen: NEGATIVE

## 2022-05-30 MED ORDER — CEPHALEXIN 500 MG PO CAPS: 500.0000 mg | ORAL_CAPSULE | Freq: Two times a day (BID) | ORAL | 0 refills | Status: DC

## 2022-05-30 NOTE — Progress Notes (Signed)
I,Pamela Taylor,acting as a Education administrator for Pamela Sprout, FNP.,have documented all relevant documentation on the behalf of Pamela Sprout, FNP,as directed by  Pamela Sprout, FNP while in the presence of Pamela Sprout, FNP.   Established patient visit   Patient: Pamela Taylor   DOB: 1965-11-08   57 y.o. Female  MRN: [id number] Visit Date: 05/30/2022  Today's healthcare provider: [name], FNP  Introduced to nurse practitioner role and practice setting.  All questions answered.  Discussed provider/patient relationship and expectations.   Chief Complaint  Patient presents with   Sore Throat   Subjective    HPI  Sore Throat: Patient complains of sore throat. Symptoms began 1 day ago. Pain is of severe severity. Fever is believed to be present, temp not taken. Other associated symptoms have included  fatigue .  Fluid intake is good.  There has been contact with an individual with known strep.  Current medications include acetaminophen.     Medications: Outpatient Medications Prior to Visit  Medication Sig   ALPRAZolam (XANAX) 0.5 MG tablet TAKE 1 TABLET BY MOUTH EVERY DAY AS NEEDED FOR ANXIETY   meclizine (ANTIVERT) 25 MG tablet Take 25 mg by mouth as needed.   PARoxetine (PAXIL) 40 MG tablet Take 1 tablet (40 mg total) by mouth daily.   valACYclovir (VALTREX) 1000 MG tablet TAKE 1 TABLET (1,000 MG TOTAL) BY MOUTH DAILY AS NEEDED (COLD SORES).   No facility-administered medications prior to visit.    Review of Systems    Objective    BP 112/76 (BP Location: Right Arm, Patient Position: Sitting, Cuff Size: Normal)   Pulse 78   Temp 98.1 F (36.7 C) (Oral)   Wt 138 lb 8 oz (62.8 kg)   LMP 11/14/2015   SpO2 99%   BMI 25.33 kg/m   Physical Exam Vitals and nursing note reviewed.  Constitutional:      General: She is not in acute distress.    Appearance: Normal appearance. She is well-developed and normal weight. She is not ill-appearing, toxic-appearing or  diaphoretic.  HENT:     Head: Normocephalic and atraumatic.     Right Ear: Tympanic membrane and ear canal normal.     Left Ear: Tympanic membrane and ear canal normal.     Nose: No congestion or rhinorrhea.     Mouth/Throat:     Mouth: Mucous membranes are moist.     Pharynx: Uvula midline. Posterior oropharyngeal erythema and uvula swelling present. No pharyngeal swelling or oropharyngeal exudate.     Tonsils: No tonsillar exudate or tonsillar abscesses. 1+ on the right. 1+ on the left.  Eyes:     Conjunctiva/sclera: Conjunctivae normal.     Pupils: Pupils are equal, round, and reactive to light.  Cardiovascular:     Rate and Rhythm: Normal rate and regular rhythm.     Pulses: Normal pulses.     Heart sounds: Normal heart sounds. No murmur heard.    No friction rub. No gallop.  Pulmonary:     Effort: Pulmonary effort is normal. No respiratory distress.     Breath sounds: Normal breath sounds. No stridor. No wheezing, rhonchi or rales.  Chest:     Chest wall: No tenderness.  Abdominal:     General: Bowel sounds are normal.     Palpations: Abdomen is soft.  Musculoskeletal:        General: No swelling, tenderness, deformity or signs of injury. Normal range of motion.  Cervical back: Normal range of motion and neck supple.     Right lower leg: No edema.     Left lower leg: No edema.  Skin:    General: Skin is warm and dry.     Capillary Refill: Capillary refill takes less than 2 seconds.     Coloration: Skin is not jaundiced or pale.     Findings: No bruising, erythema, lesion or rash.  Neurological:     General: No focal deficit present.     Mental Status: She is alert and oriented to person, place, and time. Mental status is at baseline.     Cranial Nerves: No cranial nerve deficit.     Sensory: No sensory deficit.     Motor: No weakness.     Coordination: Coordination normal.  Psychiatric:        Mood and Affect: Mood normal.        Behavior: Behavior normal.         Thought Content: Thought content normal.        Judgment: Judgment normal.     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     Vonna Kotyk, FNP, have reviewed all documentation for this visit. The documentation on 05/30/22 for the exam, diagnosis, procedures, and orders are all accurate and complete.  Pamela Taylor, Luana [phone] (phone) [phone] (fax)  [place]

## 2022-05-30 NOTE — Assessment & Plan Note (Signed)
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

## 2022-06-06 ENCOUNTER — Other Ambulatory Visit: Payer: Self-pay | Admitting: Family Medicine

## 2022-06-06 DIAGNOSIS — B001 Herpesviral vesicular dermatitis: Secondary | ICD-10-CM

## 2022-06-06 NOTE — Telephone Encounter (Signed)
Medication Refill - Medication: valACYclovir (VALTREX) 1000 MG tablet   Has the patient contacted their pharmacy? No. (Agent: If no, request that the patient contact the pharmacy for the refill. If patient does not wish to contact the pharmacy document the reason why and proceed with request.) (Agent: If yes, when and what did the pharmacy advise?)  Preferred Pharmacy (with phone number or street name):  [id number] Phone: [phone] Fax: [phone]    Has the patient been seen for an appointment in the last year OR does the patient have an upcoming appointment? Yes.    Agent: Please be advised that RX refills may take up to 3 business days. We ask that you follow-up with your pharmacy.

## 2022-06-06 NOTE — Telephone Encounter (Signed)
Requested medication (s) are due for refill today: yes  Requested medication (s) are on the active medication list: yes    Last refill: 07/06/19  #90  2 refills  Future visit scheduled yes 02/17/23  Notes to clinic:historical provider, please review. Thank you.  Requested Prescriptions  Pending Prescriptions Disp Refills   valACYclovir (VALTREX) 1000 MG tablet 90 tablet 2    Sig: Take 1 tablet (1,000 mg total) by mouth daily as needed (cold sores).     Antimicrobials:  Antiviral Agents - Anti-Herpetic Passed - 06/06/2022  4:11 PM      Passed - Valid encounter within last 12 months    Recent Outpatient Visits           1 week ago Pharyngitis due to Streptococcus species   [place] [name], FNP   3 months ago Encounter for annual physical exam   [facility], Dionne Bucy, MD   11 months ago Acute non-recurrent pansinusitis   [place], Dionne Bucy, MD   1 year ago Encounter for annual physical exam   [place], Dionne Bucy, MD   2 years ago Encounter for annual physical exam   [place], Dionne Bucy, MD       Future Appointments             In 8 months Bac[place], Dionne Bucy, MD [facility], [facility]

## 2022-06-07 MED ORDER — VALACYCLOVIR HCL 1 G PO TABS: 1000.0000 mg | ORAL_TABLET | Freq: Every day | ORAL | 2 refills | Status: AC | PRN

## 2023-02-17 ENCOUNTER — Ambulatory Visit (INDEPENDENT_AMBULATORY_CARE_PROVIDER_SITE_OTHER): Payer: BC Managed Care – PPO | Admitting: Family Medicine

## 2023-02-17 ENCOUNTER — Encounter: Payer: Self-pay | Admitting: Family Medicine

## 2023-02-17 VITALS — BP 120/81 | HR 66 | Ht 62.0 in | Wt 140.0 lb

## 2023-02-17 DIAGNOSIS — E038 Other specified hypothyroidism: Secondary | ICD-10-CM

## 2023-02-17 DIAGNOSIS — F341 Dysthymic disorder: Secondary | ICD-10-CM | POA: Diagnosis not present

## 2023-02-17 DIAGNOSIS — F419 Anxiety disorder, unspecified: Secondary | ICD-10-CM

## 2023-02-17 DIAGNOSIS — Z0000 Encounter for general adult medical examination without abnormal findings: Secondary | ICD-10-CM | POA: Diagnosis not present

## 2023-02-17 DIAGNOSIS — E7800 Pure hypercholesterolemia, unspecified: Secondary | ICD-10-CM | POA: Diagnosis not present

## 2023-02-17 DIAGNOSIS — L570 Actinic keratosis: Secondary | ICD-10-CM

## 2023-02-17 DIAGNOSIS — F411 Generalized anxiety disorder: Secondary | ICD-10-CM

## 2023-02-17 DIAGNOSIS — Z124 Encounter for screening for malignant neoplasm of cervix: Secondary | ICD-10-CM

## 2023-02-17 DIAGNOSIS — Z1231 Encounter for screening mammogram for malignant neoplasm of breast: Secondary | ICD-10-CM

## 2023-02-17 MED ORDER — ALPRAZOLAM 0.5 MG PO TABS: ORAL_TABLET | ORAL | 1 refills | Status: DC

## 2023-02-17 MED ORDER — PAROXETINE HCL 40 MG PO TABS: 40.0000 mg | ORAL_TABLET | Freq: Every day | ORAL | 1 refills | Status: DC

## 2023-02-17 NOTE — Patient Instructions (Signed)
Please contact [phone] to schedule your mammogram. You will be asked your location preference to have procedure performed. You have two options listed below.  1) [facility] located at [address] [place], [place] [address] 2) [facility] [place] located at [address] [place],  [address]  Upon results being received our office will contact you. As well as all results can be viewed through your MyChart. Please feel free to contact us if you have any further questions or concerns.   

## 2023-02-17 NOTE — Progress Notes (Signed)
Complete physical exam  Patient: Pamela Taylor   DOB: Mar 03, 1966   57 y.o. Female  MRN: [id number]  Subjective:    Chief Complaint  Patient presents with   Annual Exam    Last completed 02/11/22. Patient reports she consumes a healthy well balanced diet. She participates in exercise everyday for at least one hour unless raining. Patient reports feeling well and sleeping fine with no other concerns to report.     Pamela Taylor is a 57 y.o. female who presents today for a complete physical exam. She reports consuming a general diet.  She generally feels well. She reports sleeping well. She does not have additional problems to discuss today.   Discussed the use of AI scribe software for clinical note transcription with the patient, who gave verbal consent to proceed.  History of Present Illness   The patient, with a history of anxiety and depression, presents for a routine physical and Pap smear. She reports a recent dip in her health, attributing it to the recent passing of her mother and stress at work. Despite these challenges, she feels that her mood and anxiety are well-managed with her current medication regimen of Paxil 40mg  daily and Xanax as needed. She also mentions a skin tag that she has been picking at and would like to be frozen off. The patient is aware of the need for a mammogram and lab work, which she has been delaying due to personal circumstances.        Most recent fall risk assessment:    02/17/2023    3:46 PM  Fall Risk   Falls in the past year? 1  Number falls in past yr: 0  Injury with Fall? 0  Risk for fall due to : History of fall(s)  Follow up Falls evaluation completed     Most recent depression screenings:    02/17/2023    3:46 PM 05/30/2022    3:09 PM  PHQ 2/9 Scores  PHQ - 2 Score 1 1  PHQ- 9 Score 2 2        Patient Care Team: Erasmo Downer, MD as PCP - General (Family Medicine)   Outpatient Medications Prior to Visit   Medication Sig   meclizine (ANTIVERT) 25 MG tablet Take 25 mg by mouth as needed.   valACYclovir (VALTREX) 1000 MG tablet Take 1 tablet (1,000 mg total) by mouth daily as needed (cold sores).   [DISCONTINUED] ALPRAZolam (XANAX) 0.5 MG tablet TAKE 1 TABLET BY MOUTH EVERY DAY AS NEEDED FOR ANXIETY   [DISCONTINUED] cephALEXin (KEFLEX) 500 MG capsule Take 1 capsule (500 mg total) by mouth 2 (two) times daily.   [DISCONTINUED] PARoxetine (PAXIL) 40 MG tablet Take 1 tablet (40 mg total) by mouth daily.   No facility-administered medications prior to visit.    ROS        Objective:     BP 120/81 (BP Location: Left Arm, Patient Position: Sitting, Cuff Size: Normal)   Pulse 66   Ht 5\' 2"  (1.575 m)   Wt 140 lb (63.5 kg)   LMP 11/14/2015   SpO2 99%   BMI 25.61 kg/m    Physical Exam Vitals reviewed.  Constitutional:      General: She is not in acute distress.    Appearance: Normal appearance. She is well-developed. She is not diaphoretic.  HENT:     Head: Normocephalic and atraumatic.     Right Ear: Tympanic membrane, ear canal and external ear normal.  Left Ear: Tympanic membrane, ear canal and external ear normal.     Nose: Nose normal.     Mouth/Throat:     Mouth: Mucous membranes are moist.     Pharynx: Oropharynx is clear. No oropharyngeal exudate.  Eyes:     General: No scleral icterus.    Conjunctiva/sclera: Conjunctivae normal.     Pupils: Pupils are equal, round, and reactive to light.  Neck:     Thyroid: No thyromegaly.  Cardiovascular:     Rate and Rhythm: Normal rate and regular rhythm.     Heart sounds: Normal heart sounds. No murmur heard. Pulmonary:     Effort: Pulmonary effort is normal. No respiratory distress.     Breath sounds: Normal breath sounds. No wheezing or rales.  Abdominal:     General: There is no distension.     Palpations: Abdomen is soft.     Tenderness: There is no abdominal tenderness.  Musculoskeletal:        General: No  deformity.     Cervical back: Neck supple.     Right lower leg: No edema.     Left lower leg: No edema.  Lymphadenopathy:     Cervical: No cervical adenopathy.  Skin:    General: Skin is warm and dry.     Findings: No rash.     Comments: AK on L thigh  Neurological:     Mental Status: She is alert and oriented to person, place, and time. Mental status is at baseline.     Gait: Gait normal.  Psychiatric:        Mood and Affect: Mood normal.        Behavior: Behavior normal.        Thought Content: Thought content normal.      No results found for any visits on 02/17/23.     Assessment & Plan:    Routine Health Maintenance and Physical Exam  Immunization History  Administered Date(s) Administered   Influenza Inj Mdck Quad Pf 02/02/2018   Influenza Split 04/27/2009   Influenza,inj,Quad PF,6+ Mos 01/06/2014, 01/05/2016, 02/02/2018, 01/27/2019, 01/10/2020, 01/27/2022   Influenza,inj,Quad PF,6-35 Mos 01/15/2021   Influenza-Unspecified 02/13/2017, 12/23/2018, 01/10/2020, 01/09/2023   PFIZER(Purple Top)SARS-COV-2 Vaccination 01/10/2020   Pfizer Covid-19 Vaccine Bivalent Booster 23yrs & up 02/12/2021   Tdap 11/18/2017   Zoster Recombinant(Shingrix) 12/11/2016, 01/30/2018    Health Maintenance  Topic Date Due   COVID-19 Vaccine (3 - Pfizer risk series) 03/12/2021   MAMMOGRAM  01/30/2022   Cervical Cancer Screening (HPV/Pap Cotest)  11/19/2022   Colonoscopy  09/14/2023   DTaP/Tdap/Td (2 - Td or Tdap) 11/19/2027   INFLUENZA VACCINE  Completed   Hepatitis C Screening  Completed   HIV Screening  Completed   Zoster Vaccines- Shingrix  Completed   HPV VACCINES  Aged Out    Discussed health benefits of physical activity, and encouraged her to engage in regular exercise appropriate for her age and condition.  Problem List Items Addressed This Visit       Endocrine   Subclinical hypothyroidism   Relevant Orders   TSH + free T4     Other   GAD (generalized anxiety  disorder)   Relevant Medications   ALPRAZolam (XANAX) 0.5 MG tablet   PARoxetine (PAXIL) 40 MG tablet   Clinical depression   Relevant Medications   ALPRAZolam (XANAX) 0.5 MG tablet   PARoxetine (PAXIL) 40 MG tablet   Hypercholesterolemia without hypertriglyceridemia   Relevant Orders   Lipid panel  Comprehensive metabolic panel   Other Visit Diagnoses     Encounter for annual physical exam    -  Primary   Cervical cancer screening       Relevant Orders   Cytology - PAP   Breast cancer screening by mammogram       AK (actinic keratosis)       Acute anxiety       Relevant Medications   ALPRAZolam (XANAX) 0.5 MG tablet   PARoxetine (PAXIL) 40 MG tablet          Routine Physical Exam Annual physical exam. No acute concerns. Due for Pap smear, mammogram, and routine labs. - Perform Pap smear - Order routine labs including cholesterol, liver function, and thyroid function tests - Schedule mammogram  Actinic Keratosis Lesion likely actinic keratosis based on appearance and sun damage history. Discussed cryotherapy with freeze-thaw-refreeze method, explaining potential blistering, numbness, or tenderness post-procedure. - Freeze lesion with cryo pen using freeze-thaw-refreeze method - Advise to keep area clean and monitor for blistering or tenderness  Depression and Anxiety Reports dip in mood and increased stress due to bereavement and work issues. Managing with Paxil 40 mg daily and Xanax as needed. Occasionally adjusts Paxil dosage. Discussed monitoring mood and anxiety levels and adjusting medication as needed. - Continue Paxil 40 mg daily - Refill Xanax prescription - Monitor mood and anxiety levels, adjust medication as needed  General Health Maintenance Due for mammogram and routine labs. - Order mammogram - Order routine labs including cholesterol, liver function, and thyroid function tests - Schedule follow-up in one year unless issues arise  Follow-up -  Schedule follow-up appointment in one year - Advise to call if any new concerns arise.        Procedure: Cryotherapy Description: Cryo pen with liquid nitrogen applied to the lesion on the leg. The area was blanched, let to thaw and then repeated Informed Consent: Counseling about the risks of blistering, numbness, and tenderness. Benefits of removing the lesion. Alternatives include referral to a dermatologist.  Return in about 1 year (around 02/17/2024) for CPE.     Shirlee Latch, MD

## 2023-02-18 ENCOUNTER — Telehealth: Payer: Self-pay

## 2023-02-18 LAB — COMPREHENSIVE METABOLIC PANEL
ALT: 24 [IU]/L (ref 0–32)
AST: 24 [IU]/L (ref 0–40)
Albumin: 4.3 g/dL (ref 3.8–4.9)
Alkaline Phosphatase: 85 [IU]/L (ref 44–121)
BUN/Creatinine Ratio: 16 (ref 9–23)
BUN: 12 mg/dL (ref 6–24)
Bilirubin Total: <0.2 mg/dL (ref 0.0–1.2)
CO2: 22 mmol/L (ref 20–29)
Calcium: 9.6 mg/dL (ref 8.7–10.2)
Chloride: 106 mmol/L (ref 96–106)
Creatinine, Ser: 0.73 mg/dL (ref 0.57–1.00)
Globulin, Total: 2.1 g/dL (ref 1.5–4.5)
Glucose: 76 mg/dL (ref 70–99)
Potassium: 4 mmol/L (ref 3.5–5.2)
Sodium: 143 mmol/L (ref 134–144)
Total Protein: 6.4 g/dL (ref 6.0–8.5)
eGFR: 95 mL/min/{1.73_m2} (ref >59)

## 2023-02-18 LAB — TSH+FREE T4
Free T4: 1.12 ng/dL (ref 0.82–1.77)
TSH: 4.1 u[IU]/mL (ref 0.450–4.500)

## 2023-02-18 LAB — LIPID PANEL
Chol/HDL Ratio: 3.8 ratio (ref 0.0–4.4)
Cholesterol, Total: 229 mg/dL — ABNORMAL HIGH (ref 100–199)
HDL: 60 mg/dL (ref >39)
LDL Chol Calc (NIH): 147 mg/dL — ABNORMAL HIGH (ref 0–99)
Triglycerides: 125 mg/dL (ref 0–149)
VLDL Cholesterol Cal: 22 mg/dL (ref 5–40)

## 2023-02-18 NOTE — Telephone Encounter (Signed)
Just resulted, so I sent her a result note.

## 2023-02-18 NOTE — Telephone Encounter (Unsigned)
Copied from CRM [id number]. Topic: General - Other >> Feb 18, 2023  2:42 PM Franchot Heidelberg wrote: Reason for CRM: Pt called back for lab results, please advise

## 2023-02-19 ENCOUNTER — Telehealth: Payer: Self-pay | Admitting: *Deleted

## 2023-02-19 NOTE — Telephone Encounter (Signed)
LMOM for patient to return call to office for results or to check her mychart as the provider has sent her a message.

## 2023-02-19 NOTE — Telephone Encounter (Signed)
  Chief Complaint: Results Symptoms: NA Frequency: NA Pertinent Negatives: Patient denies NA Disposition: [] ED /[] Urgent Care (no appt availability in office) / [] Appointment(In office/virtual)/ []  [facility]/ [] Home Care/ [] Refused Recommended Disposition /[] [facility]/ []  Follow-up with PCP Additional Notes:   Pt called for lab results.Result message read to pt, verbalizes understanding.  "Normal/stable labs, except high cholesterol.  The 10-year ASCVD risk score (Arnett DK, et al., 2019) is: 2.8%  (heart disease and stroke) which is low. No need for medications, but recommend diet low in saturated fat and regular exercise - 30 min at least 5 times per week."

## 2023-02-24 LAB — CYTOLOGY - PAP: Adequacy: ABNORMAL

## 2023-02-25 ENCOUNTER — Telehealth: Payer: Self-pay | Admitting: *Deleted

## 2023-02-25 NOTE — Telephone Encounter (Signed)
See result note.  

## 2023-02-25 NOTE — Telephone Encounter (Signed)
Patient is calling for her pap results:  UNSATISFACTORY for evaluation due to obscuring inflammation. The   Adequacy specimen is processed and examined microscopically, but is found to be  Adequacy unsatisfactory for evaluation of an epithelial abnormality. Repeat study  Adequacy recommended.  Diagnosis - Non-diagnostic Abnormal    No notes from provider- does she need recollection- patient would like to know. Please call her back.

## 2023-02-27 NOTE — Telephone Encounter (Signed)
Left VM for pt to call back to schedule per Dr. B in previous message.  Okay for [facility] to schedule or call the office if not able to.

## 2023-02-27 NOTE — Telephone Encounter (Signed)
Can double book my 4pm this afternoon or next Thursday for just her pap

## 2023-02-28 ENCOUNTER — Telehealth: Payer: Self-pay

## 2023-02-28 NOTE — Telephone Encounter (Signed)
Copied from CRM [id number]. Topic: General - Other >> Feb 27, 2023  5:03 PM Macon Large wrote: Reason for CRM: Pt called back to schedule appt for next Thursday 03/06/23 at 4pm but that appt was no longer available. Pt requests call back to schedule that appt.

## 2023-03-06 ENCOUNTER — Ambulatory Visit: Payer: BC Managed Care – PPO | Admitting: Family Medicine

## 2023-03-07 ENCOUNTER — Telehealth (INDEPENDENT_AMBULATORY_CARE_PROVIDER_SITE_OTHER): Payer: BC Managed Care – PPO | Admitting: Family Medicine

## 2023-03-07 ENCOUNTER — Encounter: Payer: Self-pay | Admitting: Family Medicine

## 2023-03-07 ENCOUNTER — Ambulatory Visit: Payer: Self-pay | Admitting: *Deleted

## 2023-03-07 DIAGNOSIS — J069 Acute upper respiratory infection, unspecified: Secondary | ICD-10-CM

## 2023-03-07 MED ORDER — PREDNISONE 20 MG PO TABS: 40.0000 mg | ORAL_TABLET | Freq: Every day | ORAL | 0 refills | Status: AC

## 2023-03-07 MED ORDER — GUAIFENESIN-CODEINE 100-10 MG/5ML PO SOLN: 10.0000 mL | Freq: Three times a day (TID) | ORAL | 0 refills | Status: DC | PRN

## 2023-03-07 NOTE — Telephone Encounter (Addendum)
  Chief Complaint: Fever 101.6 for 2 days, non productive cough,  wheezing at night Symptoms: above Frequency: last 2 days Pertinent Negatives: Patient denies diarrhea or vomiting.   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 Disposition: [] ED /[] Urgent Care (no appt availability in office) / [x] Appointment(In office/virtual)/ []  [facility]/ [] Home Care/ [] Refused Recommended Disposition /[] [facility]/ []  Follow-up with PCP Additional Notes: Virtual visit appt made with Dr. Beryle Flock for this morning at 9:20.

## 2023-03-07 NOTE — Telephone Encounter (Signed)
Reason for Disposition  Fever present > 3 days (72 hours)    Fever for last 2 days, coughing and wheezing at night  Answer Assessment - Initial Assessment Questions 1. TEMPERATURE: "What is the most recent temperature?"  "How was it measured?"      Fever for 2 days now.  When laying down I'm wheezing.  I'm coughing.   No runny nose.    2. ONSET: "When did the fever start?"      2 days ago 3. CHILLS: "Do you have chills?" If yes: "How bad are they?"  (e.g., none, mild, moderate, severe)   - NONE: no chills   - MILD: feeling cold   - MODERATE: feeling very cold, some shivering (feels better under a thick blanket)   - SEVERE: feeling extremely cold with shaking chills (general body shaking, rigors; even under a thick blanket)      No chills just the fever   I have  4. OTHER SYMPTOMS: "Do you have any other symptoms besides the fever?"  (e.g., abdomen pain, cough, diarrhea, earache, headache, sore throat, urination pain)     See above 5. CAUSE: If there are no symptoms, ask: "What do you think is causing the fever?"      I don't know When it wears off the fever comes back, the fever 6. CONTACTS: "Does anyone else in the family have an infection?"     No  7. TREATMENT: "What have you done so far to treat this fever?" (e.g., medications)     Using Tylenol 8. IMMUNOCOMPROMISE: "Do you have of the following: diabetes, HIV positive, splenectomy, cancer chemotherapy, chronic steroid treatment, transplant patient, etc."     No 9. PREGNANCY: "Is there any chance you are pregnant?" "When was your last menstrual period?"     Not asked 10. TRAVEL: "Have you traveled out of the country in the last month?" (e.g., travel history, exposures)       Not asked  Protocols used: [facility]

## 2023-03-07 NOTE — Progress Notes (Signed)
MyChart Video Visit    Virtual Visit via Video Note   This format is felt to be most appropriate for this patient at this time. Physical exam was limited by quality of the video and audio technology used for the visit.    Patient location: home Provider location: [facility] Persons involved in the visit: patient, provider  I discussed the limitations of evaluation and management by telemedicine and the availability of in person appointments. The patient expressed understanding and agreed to proceed.  Patient: Pamela Taylor   DOB: 18-Jan-1966   57 y.o. Female  MRN: [id number] Visit Date: 03/07/2023  Today's healthcare provider: Shirlee Latch, MD   No chief complaint on file.  Subjective    HPI   Discussed the use of AI scribe software for clinical note transcription with the patient, who gave verbal consent to proceed.  History of Present Illness   The patient, with a history of recent illness, presents with a 3-day history of fever, cough, and fatigue. They initially felt "really, really crappy" with a headache, fatigue, cough, and body aches, prompting them to leave work early. Despite feeling unwell, they pushed through work the following day. They have been experiencing fevers, with a peak of 101.62F. The patient has been coughing a lot, describing the sputum as "yellowy, greeny." They report that their cough is painful and that they have been experiencing difficulty breathing, with their partner noting wheezing sounds when listening to their breathing. The patient has been breaking a fever several times throughout the night, resulting in drenched blankets. They deny any history of asthma and smoking.        Review of Systems      Objective    LMP 11/14/2015       Physical Exam Constitutional:      General: She is not in acute distress.    Appearance: Normal appearance.  HENT:     Head: Normocephalic.  Pulmonary:     Effort:  Pulmonary effort is normal. No respiratory distress.  Neurological:     Mental Status: She is alert and oriented to person, place, and time. Mental status is at baseline.        Assessment & Plan     Problem List Items Addressed This Visit   None Visit Diagnoses     Upper respiratory tract infection, unspecified type    -  Primary           Acute Respiratory Illness Presents with headache, fatigue, cough, body aches, and fever since Tuesday. Fever peaked at 101.95F. Reports yellow-green sputum and wheezing. No history of asthma or smoking. Differential includes viral bronchitis, influenza, COVID-19, and walking pneumonia. Likely viral etiology, possibly influenza, given acute onset and symptom severity. Discussed symptomatic treatment, prednisone burst, or antibiotics for presumed walking pneumonia (much less likely). Opted for prednisone burst. - Prescribe prednisone 40 mg daily for 7 days, taken in the morning with food - Advise against ibuprofen or Aleve with prednisone; Tylenol (acetaminophen) acceptable for fever - Prescribe prescription cough medicine with codeine for symptomatic relief - Recommend at-home COVID-19 test - Advise symptomatic treatment with over-the-counter medications such as Mucinex and honey for cough - Instruct to monitor symptoms and contact office if fever persists, shortness of breath worsens, or no improvement by Monday.      Meds ordered this encounter  Medications   predniSONE (DELTASONE) 20 MG tablet    Sig: Take 2 tablets (40 mg total) by mouth daily with breakfast  for 7 days.    Dispense:  14 tablet    Refill:  0   guaiFENesin-codeine 100-10 MG/5ML syrup    Sig: Take 10 mLs by mouth 3 (three) times daily as needed for cough.    Dispense:  120 mL    Refill:  0      Return if symptoms worsen or fail to improve.     I discussed the assessment and treatment plan with the patient. The patient was provided an opportunity to ask questions and  all were answered. The patient agreed with the plan and demonstrated an understanding of the instructions.   The patient was advised to call back or seek an in-person evaluation if the symptoms worsen or if the condition fails to improve as anticipated.   Shirlee Latch, MD [facility] [phone] (phone) [phone] (fax)  [facility]

## 2023-03-27 ENCOUNTER — Encounter: Payer: Self-pay | Admitting: Family Medicine

## 2023-03-27 ENCOUNTER — Telehealth: Payer: Self-pay | Admitting: Family Medicine

## 2023-03-27 ENCOUNTER — Ambulatory Visit (INDEPENDENT_AMBULATORY_CARE_PROVIDER_SITE_OTHER): Payer: BC Managed Care – PPO | Admitting: Family Medicine

## 2023-03-27 ENCOUNTER — Other Ambulatory Visit (HOSPITAL_COMMUNITY)
Admission: RE | Admit: 2023-03-27 | Discharge: 2023-03-27 | Disposition: A | Discharge status: Home / Self Care | Payer: BC Managed Care – PPO | Source: Ambulatory Visit | Attending: Family Medicine | Admitting: Family Medicine

## 2023-03-27 ENCOUNTER — Ambulatory Visit: Payer: BC Managed Care – PPO | Admitting: Family Medicine

## 2023-03-27 DIAGNOSIS — Z124 Encounter for screening for malignant neoplasm of cervix: Secondary | ICD-10-CM

## 2023-03-27 NOTE — Progress Notes (Signed)
   Established Patient Office Visit  Subjective   Patient ID: Pamela Taylor, female    DOB: [birth year]  Age: 57 y.o. MRN: [id number]  Chief Complaint  Patient presents with   Abnormal Pap Smear    Last Pap completed on 02/17/23 and repeat swb advised due to insufficient cells to evaluate due to inflammation of epithelial cells.    HPI  Discussed the use of AI scribe software for clinical note transcription with the patient, who gave verbal consent to proceed.  The patient, with a history of menopause, presents for a repeat Pap smear due to insufficient cells collected during the previous attempt. They also mention a precancerous skin lesion that was previously treated by a dermatologist but did not heal well. The lesion was 'cratered out' and is scheduled for further treatment. The patient expresses acceptance of these health issues as part of aging.     ROS    Objective:     LMP 11/14/2015    Physical Exam Genitourinary:    Comments: GYN:  External genitalia within normal limits.  Vaginal mucosa pink, moist, normal rugae.  Nonfriable cervix without lesions, no discharge or bleeding noted on speculum exam.       No results found for any visits on 03/27/23.    The 10-year ASCVD risk score (Arnett DK, et al., 2019) is: 2.8%    Assessment & Plan:   Problem List Items Addressed This Visit   None Visit Diagnoses       Pap smear for cervical cancer screening    -  Primary   Relevant Orders   Cytology - PAP       Inadequate Pap Smear Previous Pap smear was inconclusive due to insufficient cell collection, likely from postmenopausal changes. Plan to repeat using brush and spatula technique to improve cell collection and accuracy. - Repeat Pap smear using brush and spatula technique - Use lubricant and smallest speculum to minimize discomfort  Precancerous Skin Lesion Precancerous skin lesion previously treated with excision has not healed well, requiring  further surgical intervention. Freezing was ineffective. Scheduled for further excision next Friday. - Follow up with dermatologist for further surgical excision.    Return in about 1 year (around 03/26/2024) for CPE, as scheduled.    Shirlee Latch, MD

## 2023-03-27 NOTE — Telephone Encounter (Signed)
[facility] called and spoke to [place], [facility] about the refill(s) alprazolam requested. Advised it was sent on 02/17/23 #90/1 refill(s). He says they have it on file, it was put back when she didn't pick it up. He says it can be refilled and ready for pickup today.

## 2023-03-27 NOTE — Telephone Encounter (Signed)
Medication Refill -  Most Recent Primary Care Visit:  Provider: Erasmo Downer  Department: [facility]  Visit Type: MYCHART VIDEO VISIT  Date: 03/07/2023  Medication:  ALPRAZolam (XANAX) 0.5 MG tablet   *Patient has 3 left   Has the patient contacted their pharmacy? Patient states no, this one has to be called in by Dr B herself.  Is this the correct pharmacy for this prescription? Yes, the one listed below.  This is the patient's preferred pharmacy:  [id number] [place], [place] -  [address]  Phone:[phone] Fax:[phone]   Has the prescription been filled recently?   Receipt confirmed by pharmacy (02/17/2023  4:02 PM EST)   Is the patient out of the medication?  Patient has 3 pills left.  Has the patient been seen for an appointment in the last year OR does the patient have an upcoming appointment? Yes

## 2023-04-01 LAB — CYTOLOGY - PAP
Comment: NEGATIVE
Diagnosis: NEGATIVE
High risk HPV: NEGATIVE

## 2023-04-03 ENCOUNTER — Ambulatory Visit: Payer: BC Managed Care – PPO | Admitting: Family Medicine

## 2023-04-22 ENCOUNTER — Ambulatory Visit: Payer: BC Managed Care – PPO | Admitting: Family Medicine

## 2023-09-15 ENCOUNTER — Other Ambulatory Visit: Payer: Self-pay | Admitting: Family Medicine

## 2023-09-15 DIAGNOSIS — F419 Anxiety disorder, unspecified: Secondary | ICD-10-CM

## 2023-09-15 NOTE — Telephone Encounter (Signed)
 Copied from CRM [id number]. Topic: Clinical - Medication Refill >> Sep 15, 2023  1:45 PM Talmadge Fail S wrote: Medication: ALPRAZolam  (XANAX ) 0.5 MG tablet  Has the patient contacted their pharmacy? No (Agent: If no, request that the patient contact the pharmacy for the refill. If patient does not wish to contact the pharmacy document the reason why and proceed with request.) (Agent: If yes, when and what did the pharmacy advise?) Controlled substance  This is the patient's preferred pharmacy:  [id number] - [address] [place] [address] Phone: [phone] Fax: [phone]  Is this the correct pharmacy for this prescription? Yes If no, delete pharmacy and type the correct one.   Has the prescription been filled recently? No  Is the patient out of the medication? Yes  Has the patient been seen for an appointment in the last year OR does the patient have an upcoming appointment? Yes  Can we respond through MyChart? No  Agent: Please be advised that Rx refills may take up to 3 business days. We ask that you follow-up with your pharmacy.

## 2023-09-16 MED ORDER — ALPRAZOLAM 0.5 MG PO TABS: ORAL_TABLET | ORAL | 0 refills | Status: DC

## 2023-09-16 NOTE — Telephone Encounter (Signed)
 Requested medication (s) are due for refill today:   Provider to review  Requested medication (s) are on the active medication list:   Yes  Future visit scheduled:   Yes 11/6.    LOV 03/27/2023   Last ordered: 02/17/2023 #90, 1 refill  Non delegated refill    Requested Prescriptions  Pending Prescriptions Disp Refills   ALPRAZolam  (XANAX ) 0.5 MG tablet 90 tablet 1    Sig: TAKE 1 TABLET BY MOUTH EVERY DAY AS NEEDED FOR ANXIETY     Not Delegated - Psychiatry: Anxiolytics/Hypnotics 2 Failed - 09/16/2023  2:03 PM      Failed - This refill cannot be delegated      Failed - Urine Drug Screen completed in last 360 days      Failed - Valid encounter within last 6 months    Recent Outpatient Visits   None     Future Appointments             In 5 months Bacigalupo, Stan Eans, MD [facility], [facility]            Passed - Patient is not pregnant

## 2023-10-21 ENCOUNTER — Other Ambulatory Visit: Payer: Self-pay | Admitting: Family Medicine

## 2023-10-21 DIAGNOSIS — Z1231 Encounter for screening mammogram for malignant neoplasm of breast: Secondary | ICD-10-CM

## 2023-11-06 ENCOUNTER — Ambulatory Visit
Admission: RE | Admit: 2023-11-06 | Discharge: 2023-11-06 | Disposition: A | Discharge status: Home / Self Care | Payer: Self-pay | Source: Ambulatory Visit | Attending: Family Medicine | Admitting: Family Medicine

## 2023-11-06 DIAGNOSIS — Z1231 Encounter for screening mammogram for malignant neoplasm of breast: Secondary | ICD-10-CM | POA: Insufficient documentation

## 2023-11-11 ENCOUNTER — Ambulatory Visit: Payer: Self-pay | Admitting: Family Medicine

## 2023-11-12 ENCOUNTER — Ambulatory Visit: Payer: Self-pay

## 2023-11-12 NOTE — Telephone Encounter (Signed)
 FYI Only or Action Required?: FYI only for provider.  Patient was last seen in primary care on 03/27/2023 by Pamela Jon HERO, MD.  Called Nurse Triage reporting Results. - Informed pt of mammogram results per 7/29 note from Dr. Myrla normal mammogram, repeat in 1 yr. Pt verbalized understanding. For MyChart issues, gave pt number for MyChart help line.  Triage Disposition: Call PCP When Office is Open  Patient/caregiver understands and will follow disposition?: Yes   Copied from CRM [id number]. Topic: Clinical - Lab/Test Results >> Nov 12, 2023 11:23 AM Pamela Taylor wrote: Reason for CRM: Transferred to NT for mammogram results Reason for Disposition  Caller requesting routine or non-urgent lab result  Answer Assessment - Initial Assessment Questions 1. REASON FOR CALL: What is the main reason for your call? or How can I best help you?     Requesting mammogram results, locked out of MyChart    Pamela Jon HERO, MD    11/11/23  1:51 PM Result Note Normal mammogram. Repeat in 1 yr MM 3D SCREENING MAMMOGRAM BILATERAL BREAST   Informed pt of mammogram results per 7/29 note from Dr. Myrla normal mammogram, repeat in 1 yr. Pt verbalized understanding. For MyChart issues, gave pt number for MyChart help line.  Protocols used: Information Only Call - No Triage-A-AH, PCP Call - No Triage-A-AH

## 2024-02-02 ENCOUNTER — Other Ambulatory Visit: Payer: Self-pay | Admitting: Family Medicine

## 2024-02-02 DIAGNOSIS — F419 Anxiety disorder, unspecified: Secondary | ICD-10-CM

## 2024-02-02 NOTE — Telephone Encounter (Signed)
 Copied from CRM #[id number]. Topic: Clinical - Medication Refill >> Feb 02, 2024 10:24 AM Avram MATSU wrote: Medication: ALPRAZolam  (XANAX ) 0.5 MG tablet [[id number]]  Has the patient contacted their pharmacy? No (Agent: If no, request that the patient contact the pharmacy for the refill. If patient does not wish to contact the pharmacy document the reason why and proceed with request.) (Agent: If yes, when and what did the pharmacy advise?)  This is the patient's preferred pharmacy:  [id number] [place] [facility] - [address] [place] [address] Phone: [phone] Fax: [phone]  Is this the correct pharmacy for this prescription? Yes If no, delete pharmacy and type the correct one.   Has the prescription been filled recently? No  Is the patient out of the medication? Yes  Has the patient been seen for an appointment in the last year OR does the patient have an upcoming appointment? Yes  Can we respond through MyChart? No  Agent: Please be advised that Rx refills may take up to 3 business days. We ask that you follow-up with your pharmacy.

## 2024-02-04 NOTE — Telephone Encounter (Signed)
 Requested medication (s) are due for refill today: yes  Requested medication (s) are on the active medication list: yes  Last refill:  09/16/23  Future visit scheduled: yes  Notes to clinic:  Unable to refill per protocol, cannot delegate.      Requested Prescriptions  Pending Prescriptions Disp Refills   ALPRAZolam  (XANAX ) 0.5 MG tablet 90 tablet 0    Sig: TAKE 1 TABLET BY MOUTH EVERY DAY AS NEEDED FOR ANXIETY     Not Delegated - Psychiatry: Anxiolytics/Hypnotics 2 Failed - 02/04/2024  8:55 AM      Failed - This refill cannot be delegated      Failed - Urine Drug Screen completed in last 360 days      Failed - Valid encounter within last 6 months    Recent Outpatient Visits   None     Future Appointments             In 2 weeks Bacigalupo, Jon HERO, MD [facility], [facility] - Patient is not pregnant

## 2024-02-11 MED ORDER — ALPRAZOLAM 0.5 MG PO TABS
ORAL_TABLET | ORAL | 0 refills | Status: AC
Start: 2024-02-11 — End: ?

## 2024-02-19 ENCOUNTER — Ambulatory Visit: Payer: Self-pay | Admitting: Family Medicine

## 2024-02-19 ENCOUNTER — Encounter: Payer: Self-pay | Admitting: Family Medicine

## 2024-02-19 VITALS — BP 122/83 | HR 72 | Ht 62.0 in | Wt 142.7 lb

## 2024-02-19 DIAGNOSIS — Z1211 Encounter for screening for malignant neoplasm of colon: Secondary | ICD-10-CM

## 2024-02-19 DIAGNOSIS — E7800 Pure hypercholesterolemia, unspecified: Secondary | ICD-10-CM | POA: Diagnosis not present

## 2024-02-19 DIAGNOSIS — F411 Generalized anxiety disorder: Secondary | ICD-10-CM

## 2024-02-19 DIAGNOSIS — Z0000 Encounter for general adult medical examination without abnormal findings: Secondary | ICD-10-CM | POA: Diagnosis not present

## 2024-02-19 DIAGNOSIS — E038 Other specified hypothyroidism: Secondary | ICD-10-CM

## 2024-02-19 DIAGNOSIS — F341 Dysthymic disorder: Secondary | ICD-10-CM

## 2024-02-19 MED ORDER — PAROXETINE HCL 20 MG PO TABS: 20.0000 mg | ORAL_TABLET | Freq: Every day | ORAL | 3 refills | Status: AC

## 2024-02-19 NOTE — Progress Notes (Signed)
 Complete physical exam   Patient: Pamela Taylor   DOB: 1966/03/24   58 y.o. Female  MRN: [id number] Visit Date: 02/19/2024  Today's healthcare provider: Jon Eva, MD   Chief Complaint  Patient presents with   Annual Exam    Last completed 02/17/23 Diet -  well balanced consuming at least 3 meals a day Exercise - walking daily for 1 hour Feeling - well Sleeping - well Concerns -  none   Subjective    Pamela Taylor is a 58 y.o. female who presents today for a complete physical exam.   Discussed the use of AI scribe software for clinical note transcription with the patient, who gave verbal consent to proceed.  History of Present Illness   Pamela Taylor is a 58 year old female who presents for an annual physical exam.  She takes Paxil  20 mg daily for depression and generalized anxiety disorder and uses Xanax  0.5 mg as needed. She has subclinical hypothyroidism and hyperlipidemia. Her cholesterol levels have been borderline with some fluctuation. She enjoys cheese and vegetables in her diet and is concerned about her cholesterol following her husband's recent double bypass surgery.  She received a flu shot last month and has completed her shingles vaccinations. She is due for a colonoscopy, as her last one was in 2015.  She is using a topical lotion for lesions on her chest, which have become 'very burnt' after treatment, marking the end of a two-week treatment period. Her family history includes her husband's recent heart surgery, prompting her to consider her own cardiovascular health risks.        Last depression screening scores    02/19/2024    3:41 PM 02/17/2023    3:46 PM 05/30/2022    3:09 PM  PHQ 2/9 Scores  PHQ - 2 Score 0 1 1  PHQ- 9 Score 0 2  2      Data saved with a previous flowsheet row definition   Last fall risk screening    02/19/2024    3:41 PM  Fall Risk   Falls in the past year? 0  Number falls in past yr: 0  Risk  for fall due to : Other (Comment)  Follow up Falls evaluation completed        Medications: Outpatient Medications Prior to Visit  Medication Sig   ALPRAZolam  (XANAX ) 0.5 MG tablet TAKE 1 TABLET BY MOUTH EVERY DAY AS NEEDED FOR ANXIETY   fluorouracil (EFUDEX) 5 % cream Apply topically 2 (two) times daily.   meclizine (ANTIVERT) 25 MG tablet Take 25 mg by mouth as needed.   valACYclovir  (VALTREX ) 1000 MG tablet Take 1 tablet (1,000 mg total) by mouth daily as needed (cold sores).   [DISCONTINUED] PARoxetine  (PAXIL ) 40 MG tablet Take 1 tablet (40 mg total) by mouth daily.   No facility-administered medications prior to visit.    Review of Systems    Objective    BP 122/83 (BP Location: Left Arm, Patient Position: Sitting, Cuff Size: Normal)   Pulse 72   Ht 5' 2 (1.575 m)   Wt 142 lb 11.2 oz (64.7 kg)   LMP 11/14/2015   SpO2 99%   BMI 26.10 kg/m    Physical Exam Vitals reviewed.  Constitutional:      General: She is not in acute distress.    Appearance: Normal appearance. She is well-developed. She is not diaphoretic.  HENT:     Head: Normocephalic and atraumatic.  Right Ear: Tympanic membrane, ear canal and external ear normal.     Left Ear: Tympanic membrane, ear canal and external ear normal.     Nose: Nose normal.     Mouth/Throat:     Mouth: Mucous membranes are moist.     Pharynx: Oropharynx is clear. No oropharyngeal exudate.  Eyes:     General: No scleral icterus.    Conjunctiva/sclera: Conjunctivae normal.     Pupils: Pupils are equal, round, and reactive to light.  Neck:     Thyroid: No thyromegaly.  Cardiovascular:     Rate and Rhythm: Normal rate and regular rhythm.     Heart sounds: Normal heart sounds. No murmur heard. Pulmonary:     Effort: Pulmonary effort is normal. No respiratory distress.     Breath sounds: Normal breath sounds. No wheezing or rales.  Abdominal:     General: There is no distension.     Palpations: Abdomen is soft.      Tenderness: There is no abdominal tenderness.  Musculoskeletal:        General: No deformity.     Cervical back: Neck supple.     Right lower leg: No edema.     Left lower leg: No edema.  Lymphadenopathy:     Cervical: No cervical adenopathy.  Skin:    General: Skin is warm and dry.     Findings: No rash.  Neurological:     Mental Status: She is alert and oriented to person, place, and time. Mental status is at baseline.     Gait: Gait normal.  Psychiatric:        Mood and Affect: Mood normal.        Behavior: Behavior normal.        Thought Content: Thought content normal.      The 10-year ASCVD risk score (Arnett DK, et al., 2019) is: 3.1%   No results found for any visits on 02/19/24.  Assessment & Plan    Routine Health Maintenance and Physical Exam  Exercise Activities and Dietary recommendations  Goals   None     Immunization History  Administered Date(s) Administered   Fluzone Influenza virus vaccine,trivalent (IIV3), split virus 03/18/2013   Influenza Inj Mdck Quad Pf 02/02/2018   Influenza Split 04/27/2009   Influenza, Seasonal, Injecte, Preservative Fre 01/11/2023, 01/20/2024   Influenza,inj,Quad PF,6+ Mos 01/06/2014, 01/05/2016, 02/02/2018, 01/27/2019, 01/10/2020, 01/27/2022   Influenza,inj,Quad PF,6-35 Mos 01/15/2021   Influenza-Unspecified 02/13/2017, 12/23/2018, 01/10/2020, 01/09/2023   PFIZER(Purple Top)SARS-COV-2 Vaccination 05/29/2019, 06/21/2019, 01/10/2020   Pfizer Covid-19 Vaccine Bivalent Booster 60yrs & up 02/12/2021   Tdap 11/18/2017   Zoster Recombinant(Shingrix) 12/11/2016, 12/25/2016, 01/30/2018    Health Maintenance  Topic Date Due   Pneumococcal Vaccine: 50+ Years (1 of 1 - PCV) Never done   Colonoscopy  09/14/2023   COVID-19 Vaccine (5 - 2025-26 season) 12/15/2023   Mammogram  11/05/2025   DTaP/Tdap/Td (2 - Td or Tdap) 11/19/2027   Cervical Cancer Screening (HPV/Pap Cotest)  03/26/2028   Influenza Vaccine  Completed   Hepatitis C  Screening  Completed   HIV Screening  Completed   Zoster Vaccines- Shingrix  Completed   Hepatitis B Vaccines 19-59 Average Risk  Aged Out   HPV VACCINES  Aged Out   Meningococcal B Vaccine  Aged Out    Discussed health benefits of physical activity, and encouraged her to engage in regular exercise appropriate for her age and condition.  Problem List Items Addressed This Visit  Endocrine   Subclinical hypothyroidism   Relevant Orders   TSH + free T4     Other   GAD (generalized anxiety disorder)   Relevant Medications   PARoxetine  (PAXIL ) 20 MG tablet   Clinical depression   Relevant Medications   PARoxetine  (PAXIL ) 20 MG tablet   Hypercholesterolemia without hypertriglyceridemia   Relevant Orders   Comprehensive metabolic panel with GFR   Lipid panel   Other Visit Diagnoses       Encounter for annual physical exam    -  Primary   Relevant Orders   Comprehensive metabolic panel with GFR   Lipid panel   TSH + free T4     Screening for colon cancer       Relevant Orders   Ambulatory referral to Gastroenterology           Adult Wellness Visit Annual physical examination conducted. Blood pressure is well-controlled. Recent flu shot and shingles vaccination completed. Mammogram and Pap smear are up to date. Discussed pneumonia vaccination, which is a one-time shot recommended at age 38. COVID boosters are available but not mandatory. Discussed colon cancer screening options, including colonoscopy and Cologuard. Colonoscopy is preferred for comprehensive screening and diagnosis. - Administered pneumonia vaccination - Scheduled colonoscopy with [facility] - Ordered blood work including cholesterol, kidney and liver function, electrolytes, blood sugar, and thyroid function  Screening for colon cancer Colonoscopy is due as the last one was performed in 2015 with normal results. Discussed Cologuard as an alternative, but colonoscopy is preferred for comprehensive  screening and diagnosis. - Scheduled colonoscopy with [facility]  Hyperlipidemia Cholesterol levels have been borderline, with a slight increase in 2024. Discussed options for further risk stratification, including a low dose statin or a CT scan to assess coronary artery calcifications. Decision to wait for blood work results before making a decision on statin therapy. - Ordered blood work to assess cholesterol levels - Will consider low dose statin or CT scan for coronary artery calcifications based on blood work results  Subclinical hypothyroidism Thyroid function is monitored as part of routine blood work. - Ordered thyroid function tests as part of blood work  Generalized anxiety disorder and depression Currently managed with Paxil  40 mg daily and Xanax  0.5 mg as needed. Discussed medication refills and current dosage. - Sent prescription for Paxil  20 mg to pharmacy - Continue Xanax  0.5 mg as needed        Return in about 1 year (around 02/18/2025) for CPE.     Jon Eva, MD  [facility] [phone] (phone) [phone] (fax)  [facility]

## 2024-02-20 LAB — COMPREHENSIVE METABOLIC PANEL WITH GFR
ALT: 14 IU/L (ref 0–32)
AST: 23 IU/L (ref 0–40)
Albumin: 4.3 g/dL (ref 3.8–4.9)
Alkaline Phosphatase: 67 IU/L (ref 49–135)
BUN/Creatinine Ratio: 19 (ref 12–28)
BUN: 14 mg/dL (ref 8–27)
Bilirubin Total: 0.2 mg/dL (ref 0.0–1.2)
CO2: 22 mmol/L (ref 20–29)
Calcium: 9.4 mg/dL (ref 8.7–10.3)
Chloride: 105 mmol/L (ref 96–106)
Creatinine, Ser: 0.74 mg/dL (ref 0.57–1.00)
Globulin, Total: 2.4 g/dL (ref 1.5–4.5)
Glucose: 85 mg/dL (ref 70–99)
Potassium: 4.1 mmol/L (ref 3.5–5.2)
Sodium: 141 mmol/L (ref 134–144)
Total Protein: 6.7 g/dL (ref 6.0–8.5)
eGFR: 93 mL/min/1.73 (ref >59)

## 2024-02-20 LAB — LIPID PANEL
Chol/HDL Ratio: 4.1 ratio (ref 0.0–4.4)
Cholesterol, Total: 239 mg/dL — ABNORMAL HIGH (ref 100–199)
HDL: 58 mg/dL (ref >39)
LDL Chol Calc (NIH): 148 mg/dL — ABNORMAL HIGH (ref 0–99)
Triglycerides: 182 mg/dL — ABNORMAL HIGH (ref 0–149)
VLDL Cholesterol Cal: 33 mg/dL (ref 5–40)

## 2024-02-20 LAB — TSH+FREE T4
Free T4: 0.98 ng/dL (ref 0.82–1.77)
TSH: 3.29 u[IU]/mL (ref 0.450–4.500)

## 2024-02-23 ENCOUNTER — Telehealth: Payer: Self-pay

## 2024-02-23 NOTE — Telephone Encounter (Unsigned)
 Copied from CRM [id number]. Topic: Clinical - Lab/Test Results >> Feb 23, 2024 10:41 AM Ivette P wrote: Reason for CRM: Pt called in about labs from 11/06 has no determination from provider.    Please follow up pt with results and determination. Pt did request if lab and determination could be emailed. I advised I would provider but unsure if that is a request we can complete. Pls advise.    Pt follow up [phone]    [email or web address]

## 2024-02-24 ENCOUNTER — Ambulatory Visit: Payer: Self-pay | Admitting: Family Medicine

## 2024-02-25 ENCOUNTER — Telehealth: Payer: Self-pay

## 2024-02-25 DIAGNOSIS — E7800 Pure hypercholesterolemia, unspecified: Secondary | ICD-10-CM

## 2024-02-25 NOTE — Telephone Encounter (Signed)
 Copied from CRM [id number]. Topic: Clinical - Lab/Test Results >> Feb 24, 2024  3:17 PM Wess RAMAN wrote: Reason for CRM: Patient would like a call from Jon CHRISTELLA Eva, MD to go over her labs.  Callback #: [phone]

## 2024-02-25 NOTE — Telephone Encounter (Signed)
 See telephone encounter.

## 2024-02-25 NOTE — Telephone Encounter (Signed)
 Patient advised of results and verbalized understanding. Reports she exercises daily and already eating pretty healthy and would like to know what form of statin you would recommended for her to take or supplement that could possibly assist her. Please advise  Aware provider not in office today and okay with waiting until she returns

## 2024-02-26 NOTE — Telephone Encounter (Signed)
 Pt advised. Verbalized understanding.

## 2024-02-26 NOTE — Telephone Encounter (Signed)
 Let's get the coronary calcium score CT that we discussed to further risk stratify her to know whether we need a statin or not.  Ok to place order - use HLD diagnosis.

## 2024-02-26 NOTE — Addendum Note (Signed)
 Addended by: LILIAN SEVERO RAMAN on: 02/26/2024 05:01 PM   Modules accepted: Orders

## 2025-02-21 ENCOUNTER — Encounter: Admitting: Family Medicine
